# Patient Record
Sex: Female | Born: 1935
Health system: Southern US, Community
[De-identification: ages and names within clinical notes are randomized; demographics above are authoritative.]

## PROBLEM LIST (undated history)

## (undated) DIAGNOSIS — I82409 Acute embolism and thrombosis of unspecified deep veins of unspecified lower extremity: Secondary | ICD-10-CM

## (undated) DIAGNOSIS — I272 Pulmonary hypertension, unspecified: Secondary | ICD-10-CM

## (undated) DIAGNOSIS — E119 Type 2 diabetes mellitus without complications: Secondary | ICD-10-CM

## (undated) DIAGNOSIS — C801 Malignant (primary) neoplasm, unspecified: Secondary | ICD-10-CM

## (undated) DIAGNOSIS — I10 Essential (primary) hypertension: Secondary | ICD-10-CM

## (undated) DIAGNOSIS — I2510 Atherosclerotic heart disease of native coronary artery without angina pectoris: Secondary | ICD-10-CM

## (undated) DIAGNOSIS — E7800 Pure hypercholesterolemia, unspecified: Secondary | ICD-10-CM

## (undated) DIAGNOSIS — G629 Polyneuropathy, unspecified: Secondary | ICD-10-CM

## (undated) DIAGNOSIS — I4891 Unspecified atrial fibrillation: Secondary | ICD-10-CM

## (undated) HISTORY — PX: EXPLORATION POST OPERATIVE OPEN HEART: SHX5061

## (undated) HISTORY — PX: ABDOMINAL HYSTERECTOMY: SHX81

---

## 2004-01-16 ENCOUNTER — Ambulatory Visit: Payer: Self-pay | Admitting: Oncology

## 2004-02-16 ENCOUNTER — Ambulatory Visit: Payer: Self-pay | Admitting: Oncology

## 2004-03-17 ENCOUNTER — Ambulatory Visit: Payer: Self-pay | Admitting: Oncology

## 2004-04-17 ENCOUNTER — Ambulatory Visit: Payer: Self-pay | Admitting: Oncology

## 2004-05-16 ENCOUNTER — Ambulatory Visit: Payer: Self-pay | Admitting: Unknown Physician Specialty

## 2004-05-17 ENCOUNTER — Inpatient Hospital Stay: Payer: Self-pay | Admitting: Unknown Physician Specialty

## 2004-05-18 ENCOUNTER — Ambulatory Visit: Payer: Self-pay | Admitting: Oncology

## 2004-06-15 ENCOUNTER — Ambulatory Visit: Payer: Self-pay | Admitting: Oncology

## 2004-07-16 ENCOUNTER — Ambulatory Visit: Payer: Self-pay | Admitting: Oncology

## 2004-08-15 ENCOUNTER — Ambulatory Visit: Payer: Self-pay | Admitting: Oncology

## 2004-08-16 ENCOUNTER — Ambulatory Visit: Payer: Self-pay | Admitting: Ophthalmology

## 2004-08-23 ENCOUNTER — Ambulatory Visit: Payer: Self-pay | Admitting: Ophthalmology

## 2004-09-15 ENCOUNTER — Ambulatory Visit: Payer: Self-pay | Admitting: Oncology

## 2004-10-05 ENCOUNTER — Ambulatory Visit: Payer: Self-pay | Admitting: Internal Medicine

## 2004-10-15 ENCOUNTER — Ambulatory Visit: Payer: Self-pay | Admitting: Oncology

## 2004-10-31 ENCOUNTER — Ambulatory Visit: Payer: Self-pay | Admitting: Unknown Physician Specialty

## 2004-11-15 ENCOUNTER — Ambulatory Visit: Payer: Self-pay | Admitting: Oncology

## 2005-03-07 ENCOUNTER — Ambulatory Visit: Payer: Self-pay | Admitting: Oncology

## 2005-03-17 ENCOUNTER — Ambulatory Visit: Payer: Self-pay | Admitting: Oncology

## 2005-05-03 ENCOUNTER — Ambulatory Visit: Payer: Self-pay | Admitting: Oncology

## 2005-05-18 ENCOUNTER — Ambulatory Visit: Payer: Self-pay | Admitting: Oncology

## 2005-07-31 ENCOUNTER — Ambulatory Visit: Payer: Self-pay | Admitting: Internal Medicine

## 2005-09-01 ENCOUNTER — Ambulatory Visit: Payer: Self-pay | Admitting: Oncology

## 2005-09-02 ENCOUNTER — Emergency Department: Payer: Self-pay | Admitting: Emergency Medicine

## 2005-09-15 ENCOUNTER — Ambulatory Visit: Payer: Self-pay | Admitting: Oncology

## 2005-10-15 ENCOUNTER — Ambulatory Visit: Payer: Self-pay | Admitting: Oncology

## 2005-10-16 ENCOUNTER — Ambulatory Visit: Payer: Self-pay | Admitting: Internal Medicine

## 2006-01-25 ENCOUNTER — Ambulatory Visit: Payer: Self-pay | Admitting: Internal Medicine

## 2006-02-28 ENCOUNTER — Ambulatory Visit: Payer: Self-pay | Admitting: Oncology

## 2006-03-17 ENCOUNTER — Ambulatory Visit: Payer: Self-pay | Admitting: Oncology

## 2006-08-29 ENCOUNTER — Ambulatory Visit: Payer: Self-pay | Admitting: Oncology

## 2006-09-16 ENCOUNTER — Ambulatory Visit: Payer: Self-pay | Admitting: Oncology

## 2006-10-25 ENCOUNTER — Ambulatory Visit: Payer: Self-pay | Admitting: Internal Medicine

## 2006-11-02 ENCOUNTER — Ambulatory Visit: Payer: Self-pay | Admitting: Internal Medicine

## 2007-02-20 ENCOUNTER — Ambulatory Visit: Payer: Self-pay | Admitting: Unknown Physician Specialty

## 2007-03-04 ENCOUNTER — Ambulatory Visit: Payer: Self-pay | Admitting: Oncology

## 2007-03-18 ENCOUNTER — Ambulatory Visit: Payer: Self-pay | Admitting: Oncology

## 2007-04-18 ENCOUNTER — Ambulatory Visit: Payer: Self-pay | Admitting: Oncology

## 2007-05-20 ENCOUNTER — Ambulatory Visit: Payer: Self-pay | Admitting: Oncology

## 2007-09-16 ENCOUNTER — Ambulatory Visit: Payer: Self-pay | Admitting: Oncology

## 2007-09-19 ENCOUNTER — Ambulatory Visit: Payer: Self-pay | Admitting: Internal Medicine

## 2007-09-23 ENCOUNTER — Ambulatory Visit: Payer: Self-pay | Admitting: Oncology

## 2007-10-16 ENCOUNTER — Ambulatory Visit: Payer: Self-pay | Admitting: Oncology

## 2007-10-16 ENCOUNTER — Ambulatory Visit: Payer: Self-pay | Admitting: Internal Medicine

## 2007-11-16 ENCOUNTER — Ambulatory Visit: Payer: Self-pay | Admitting: Oncology

## 2007-12-17 ENCOUNTER — Ambulatory Visit: Payer: Self-pay | Admitting: Oncology

## 2008-03-17 ENCOUNTER — Ambulatory Visit: Payer: Self-pay | Admitting: Oncology

## 2008-04-01 ENCOUNTER — Ambulatory Visit: Payer: Self-pay | Admitting: Oncology

## 2008-04-17 ENCOUNTER — Ambulatory Visit: Payer: Self-pay | Admitting: Oncology

## 2008-09-15 ENCOUNTER — Ambulatory Visit: Payer: Self-pay | Admitting: Oncology

## 2008-10-01 ENCOUNTER — Ambulatory Visit: Payer: Self-pay | Admitting: Oncology

## 2008-10-15 ENCOUNTER — Ambulatory Visit: Payer: Self-pay | Admitting: Oncology

## 2008-11-15 ENCOUNTER — Ambulatory Visit: Payer: Self-pay | Admitting: Oncology

## 2009-01-15 ENCOUNTER — Ambulatory Visit: Payer: Self-pay | Admitting: Oncology

## 2009-01-31 ENCOUNTER — Inpatient Hospital Stay: Payer: Self-pay | Admitting: Internal Medicine

## 2009-02-15 ENCOUNTER — Ambulatory Visit: Payer: Self-pay | Admitting: Oncology

## 2009-05-27 ENCOUNTER — Encounter: Payer: Self-pay | Admitting: Cardiology

## 2009-06-15 ENCOUNTER — Encounter: Payer: Self-pay | Admitting: Cardiology

## 2009-09-15 ENCOUNTER — Ambulatory Visit: Payer: Self-pay | Admitting: Oncology

## 2009-09-30 ENCOUNTER — Ambulatory Visit: Payer: Self-pay | Admitting: Oncology

## 2009-10-15 ENCOUNTER — Ambulatory Visit: Payer: Self-pay | Admitting: Oncology

## 2010-01-05 ENCOUNTER — Ambulatory Visit: Payer: Self-pay | Admitting: Internal Medicine

## 2010-10-04 ENCOUNTER — Ambulatory Visit: Payer: Self-pay | Admitting: Oncology

## 2010-10-16 ENCOUNTER — Ambulatory Visit: Payer: Self-pay | Admitting: Oncology

## 2010-10-31 ENCOUNTER — Ambulatory Visit: Payer: Self-pay

## 2011-01-30 ENCOUNTER — Ambulatory Visit: Payer: Self-pay | Admitting: Internal Medicine

## 2011-10-05 ENCOUNTER — Ambulatory Visit: Payer: Self-pay | Admitting: Oncology

## 2011-10-05 LAB — COMPREHENSIVE METABOLIC PANEL
Alkaline Phosphatase: 113 U/L (ref 50–136)
Anion Gap: 10 (ref 7–16)
BUN: 16 mg/dL (ref 7–18)
Calcium, Total: 9 mg/dL (ref 8.5–10.1)
Co2: 24 mmol/L (ref 21–32)
EGFR (African American): 54 — ABNORMAL LOW
EGFR (Non-African Amer.): 47 — ABNORMAL LOW
Potassium: 4.4 mmol/L (ref 3.5–5.1)
SGPT (ALT): 30 U/L
Sodium: 138 mmol/L (ref 136–145)
Total Protein: 7.6 g/dL (ref 6.4–8.2)

## 2011-10-05 LAB — CBC CANCER CENTER
Basophil #: 0 x10 3/mm (ref 0.0–0.1)
Basophil %: 0.6 %
Eosinophil #: 0.1 x10 3/mm (ref 0.0–0.7)
Eosinophil %: 0.9 %
HGB: 12.8 g/dL (ref 12.0–16.0)
Lymphocyte #: 2 x10 3/mm (ref 1.0–3.6)
Lymphocyte %: 28.4 %
MCH: 29.1 pg (ref 26.0–34.0)
MCHC: 31.8 g/dL — ABNORMAL LOW (ref 32.0–36.0)
MCV: 92 fL (ref 80–100)
Monocyte #: 0.4 x10 3/mm (ref 0.2–0.9)
Monocyte %: 5.4 %
RDW: 14.6 % — ABNORMAL HIGH (ref 11.5–14.5)
WBC: 7.2 x10 3/mm (ref 3.6–11.0)

## 2011-10-06 LAB — CANCER ANTIGEN 27.29: CA 27.29: 14.5 U/mL (ref 0.0–38.6)

## 2011-10-16 ENCOUNTER — Ambulatory Visit: Payer: Self-pay | Admitting: Oncology

## 2012-02-02 ENCOUNTER — Ambulatory Visit: Payer: Self-pay

## 2013-02-03 ENCOUNTER — Ambulatory Visit: Payer: Self-pay

## 2014-02-13 ENCOUNTER — Ambulatory Visit: Payer: Self-pay | Admitting: Internal Medicine

## 2014-05-20 DIAGNOSIS — Z79899 Other long term (current) drug therapy: Secondary | ICD-10-CM | POA: Diagnosis not present

## 2014-05-20 DIAGNOSIS — E119 Type 2 diabetes mellitus without complications: Secondary | ICD-10-CM | POA: Diagnosis not present

## 2014-05-27 DIAGNOSIS — E785 Hyperlipidemia, unspecified: Secondary | ICD-10-CM | POA: Diagnosis not present

## 2014-05-27 DIAGNOSIS — I10 Essential (primary) hypertension: Secondary | ICD-10-CM | POA: Diagnosis not present

## 2014-05-27 DIAGNOSIS — E119 Type 2 diabetes mellitus without complications: Secondary | ICD-10-CM | POA: Diagnosis not present

## 2014-06-04 DIAGNOSIS — I2510 Atherosclerotic heart disease of native coronary artery without angina pectoris: Secondary | ICD-10-CM | POA: Diagnosis not present

## 2014-06-04 DIAGNOSIS — I10 Essential (primary) hypertension: Secondary | ICD-10-CM | POA: Diagnosis not present

## 2014-06-04 DIAGNOSIS — E782 Mixed hyperlipidemia: Secondary | ICD-10-CM | POA: Diagnosis not present

## 2014-06-04 DIAGNOSIS — E119 Type 2 diabetes mellitus without complications: Secondary | ICD-10-CM | POA: Diagnosis not present

## 2014-08-27 DIAGNOSIS — E119 Type 2 diabetes mellitus without complications: Secondary | ICD-10-CM | POA: Diagnosis not present

## 2014-08-27 DIAGNOSIS — Z79899 Other long term (current) drug therapy: Secondary | ICD-10-CM | POA: Diagnosis not present

## 2014-08-27 DIAGNOSIS — I10 Essential (primary) hypertension: Secondary | ICD-10-CM | POA: Diagnosis not present

## 2014-08-27 DIAGNOSIS — I2510 Atherosclerotic heart disease of native coronary artery without angina pectoris: Secondary | ICD-10-CM | POA: Diagnosis not present

## 2014-08-27 DIAGNOSIS — E782 Mixed hyperlipidemia: Secondary | ICD-10-CM | POA: Diagnosis not present

## 2014-09-03 DIAGNOSIS — I10 Essential (primary) hypertension: Secondary | ICD-10-CM | POA: Diagnosis not present

## 2014-09-03 DIAGNOSIS — I2510 Atherosclerotic heart disease of native coronary artery without angina pectoris: Secondary | ICD-10-CM | POA: Diagnosis not present

## 2014-09-03 DIAGNOSIS — E782 Mixed hyperlipidemia: Secondary | ICD-10-CM | POA: Diagnosis not present

## 2014-09-03 DIAGNOSIS — R0789 Other chest pain: Secondary | ICD-10-CM | POA: Diagnosis not present

## 2014-10-06 DIAGNOSIS — E119 Type 2 diabetes mellitus without complications: Secondary | ICD-10-CM | POA: Diagnosis not present

## 2014-11-11 DIAGNOSIS — L821 Other seborrheic keratosis: Secondary | ICD-10-CM | POA: Diagnosis not present

## 2014-11-11 DIAGNOSIS — L570 Actinic keratosis: Secondary | ICD-10-CM | POA: Diagnosis not present

## 2014-11-11 DIAGNOSIS — Z1283 Encounter for screening for malignant neoplasm of skin: Secondary | ICD-10-CM | POA: Diagnosis not present

## 2014-11-11 DIAGNOSIS — D230 Other benign neoplasm of skin of lip: Secondary | ICD-10-CM | POA: Diagnosis not present

## 2014-11-18 ENCOUNTER — Emergency Department: Payer: Commercial Managed Care - HMO

## 2014-11-18 ENCOUNTER — Encounter: Payer: Self-pay | Admitting: Medical Oncology

## 2014-11-18 ENCOUNTER — Emergency Department
Admission: EM | Admit: 2014-11-18 | Discharge: 2014-11-18 | Disposition: A | Discharge status: Home / Self Care | Payer: Commercial Managed Care - HMO | Attending: Emergency Medicine | Admitting: Emergency Medicine

## 2014-11-18 ENCOUNTER — Other Ambulatory Visit: Payer: Self-pay

## 2014-11-18 DIAGNOSIS — Z7982 Long term (current) use of aspirin: Secondary | ICD-10-CM | POA: Diagnosis not present

## 2014-11-18 DIAGNOSIS — Z79899 Other long term (current) drug therapy: Secondary | ICD-10-CM | POA: Diagnosis not present

## 2014-11-18 DIAGNOSIS — I10 Essential (primary) hypertension: Secondary | ICD-10-CM | POA: Diagnosis not present

## 2014-11-18 DIAGNOSIS — R51 Headache: Secondary | ICD-10-CM | POA: Diagnosis present

## 2014-11-18 DIAGNOSIS — E119 Type 2 diabetes mellitus without complications: Secondary | ICD-10-CM | POA: Diagnosis not present

## 2014-11-18 DIAGNOSIS — M5022 Other cervical disc displacement, mid-cervical region: Secondary | ICD-10-CM | POA: Diagnosis not present

## 2014-11-18 DIAGNOSIS — R079 Chest pain, unspecified: Secondary | ICD-10-CM | POA: Diagnosis not present

## 2014-11-18 DIAGNOSIS — D329 Benign neoplasm of meninges, unspecified: Secondary | ICD-10-CM | POA: Diagnosis not present

## 2014-11-18 DIAGNOSIS — Z794 Long term (current) use of insulin: Secondary | ICD-10-CM | POA: Diagnosis not present

## 2014-11-18 DIAGNOSIS — G9389 Other specified disorders of brain: Secondary | ICD-10-CM | POA: Diagnosis not present

## 2014-11-18 DIAGNOSIS — G4459 Other complicated headache syndrome: Secondary | ICD-10-CM

## 2014-11-18 HISTORY — DX: Essential (primary) hypertension: I10

## 2014-11-18 HISTORY — DX: Malignant (primary) neoplasm, unspecified: C80.1

## 2014-11-18 HISTORY — DX: Type 2 diabetes mellitus without complications: E11.9

## 2014-11-18 LAB — BASIC METABOLIC PANEL
ANION GAP: 9 (ref 5–15)
BUN: 17 mg/dL (ref 6–20)
CO2: 27 mmol/L (ref 22–32)
Calcium: 8.8 mg/dL — ABNORMAL LOW (ref 8.9–10.3)
Chloride: 102 mmol/L (ref 101–111)
Creatinine, Ser: 1.11 mg/dL — ABNORMAL HIGH (ref 0.44–1.00)
GFR calc Af Amer: 51 mL/min — ABNORMAL LOW (ref >60)
GFR, EST NON AFRICAN AMERICAN: 44 mL/min — AB (ref >60)
GLUCOSE: 159 mg/dL — AB (ref 65–99)
POTASSIUM: 4.7 mmol/L (ref 3.5–5.1)
Sodium: 138 mmol/L (ref 135–145)

## 2014-11-18 LAB — CBC
HCT: 40.7 % (ref 35.0–47.0)
Hemoglobin: 12.9 g/dL (ref 12.0–16.0)
MCH: 28.2 pg (ref 26.0–34.0)
MCHC: 31.7 g/dL — ABNORMAL LOW (ref 32.0–36.0)
MCV: 89 fL (ref 80.0–100.0)
PLATELETS: 209 10*3/uL (ref 150–440)
RBC: 4.57 MIL/uL (ref 3.80–5.20)
RDW: 15.1 % — AB (ref 11.5–14.5)
WBC: 7.5 10*3/uL (ref 3.6–11.0)

## 2014-11-18 LAB — TROPONIN I
Troponin I: <0.03 ng/mL (ref <0.031)
Troponin I: <0.03 ng/mL (ref <0.031)

## 2014-11-18 MED ORDER — FENTANYL CITRATE (PF) 100 MCG/2ML IJ SOLN
25.0000 ug | Freq: Once | INTRAMUSCULAR | Status: AC
  Administered 2014-11-18: 25 ug via INTRAVENOUS
  Filled 2014-11-18: qty 2

## 2014-11-18 MED ORDER — GABAPENTIN 300 MG PO CAPS
300.0000 mg | ORAL_CAPSULE | Freq: Two times a day (BID) | ORAL | Status: DC
  Administered 2014-11-18: 300 mg via ORAL
  Filled 2014-11-18: qty 1

## 2014-11-18 NOTE — ED Notes (Signed)
Pt with daughter who reports pt began having headache to the back of her head last night. Denies injury. Pt alert and oriented x4, speaking in complete sentences. Pt also reports that she has been having intermittent rt sided chest pain for about 3 months.

## 2014-11-18 NOTE — ED Provider Notes (Signed)
[facility] Emergency Department Provider Note  ____________________________________________  Time seen: Approximately 12:42 PM  I have reviewed the triage vital signs and the nursing notes.   HISTORY  Chief Complaint Headache and Chest Pain    HPI Pamela Chaney is a 81 y.o. female patient complains of sharp stabbing pain in the right side of the back of the head last seconds and goes away started last night. Nothing seems to bring it on or make it better or worse. Patient is never had this before. Pain is severe   Past Medical History  Diagnosis Date  . Hypertension   . Diabetes mellitus without complication   . Cancer     colon    There are no active problems to display for this patient.   Past Surgical History  Procedure Laterality Date  . Exploration post operative open heart    . Abdominal hysterectomy      Current Outpatient Rx  Name  Route  Sig  Dispense  Refill  . aspirin EC 81 MG tablet   Oral   Take 1 tablet by mouth daily.         Marland Kitchen glipiZIDE (GLUCOTROL) 5 MG tablet   Oral   Take 1 tablet by mouth 2 (two) times daily.         Marland Kitchen HUMULIN 70/30 KWIKPEN (70-30) 100 UNIT/ML PEN   Subcutaneous   Inject 20 Units into the skin 2 (two) times daily.            Dispense as written.   . metoprolol tartrate (LOPRESSOR) 25 MG tablet   Oral   Take 1 tablet by mouth daily.         Marland Kitchen PARoxetine (PAXIL) 20 MG tablet   Oral   Take 1 tablet by mouth daily.         Marland Kitchen acetaminophen (TYLENOL) 500 MG tablet   Oral   Take 1 tablet by mouth every 6 (six) hours as needed.         . Simethicone (GAS RELIEF PO)   Oral   Take 1 tablet by mouth daily as needed.            Allergies Lisinopril and Morphine and related  No family history on file.  Social History History  Substance Use Topics  . Smoking status: Never Smoker   . Smokeless tobacco: Not on file  . Alcohol Use: No    Review of Systems Constitutional: No  fever/chills Eyes: No visual changes. ENT: No sore throat. Cardiovascular: Denies chest pain. Respiratory: Denies shortness of breath. Gastrointestinal: No abdominal pain.  No nausea, no vomiting.  No diarrhea.  No constipation. Genitourinary: Negative for dysuria. Musculoskeletal: Negative for back pain. Skin: Negative for rash. Neurological: Negative for headaches, focal weakness or numbness.  10-point ROS otherwise negative.  ____________________________________________   PHYSICAL EXAM:  VITAL SIGNS: ED Triage Vitals  Enc Vitals Group     BP 11/18/14 0955 168/56 mmHg     Pulse Rate 11/18/14 0955 64     Resp 11/18/14 0955 19     Temp 11/18/14 0955 98.2 F (36.8 C)     Temp Source 11/18/14 0955 Oral     SpO2 11/18/14 0955 92 %     Weight 11/18/14 0955 210 lb (95.255 kg)     Height 11/18/14 0955 5\' 8"  (1.727 m)     Head Cir --      Peak Flow --      Pain Score  11/18/14 0956 8     Pain Loc --      Pain Edu? --      Excl. in [place]? --     Constitutional: Alert and oriented. Well appearing and in no acute distress. Eyes: Conjunctivae are normal. PERRL. EOMI. Head: Atraumatic. Nose: No congestion/rhinnorhea. Mouth/Throat: Mucous membranes are moist.  Oropharynx non-erythematous. Neck: No stridor.   Cardiovascular: Normal rate, regular rhythm. Grossly normal heart sounds.  Good peripheral circulation. Respiratory: Normal respiratory effort.  No retractions. Lungs CTAB. Gastrointestinal: Soft and nontender. No distention. No abdominal bruits. No CVA tenderness. Musculoskeletal: No lower extremity tenderness nor edema.  No joint effusions. Neurologic:  Normal speech and language. No gross focal neurologic deficits are appreciated. No gait instability. Cranial nerves II through XII are intact cerebellar rapid alternating movements in the hands finger to nose and heel to shin are all normal motor strength is 5 over 5 throughout sensation is intact throughout there are no trigger  points in the back of her head or neck. Make the pain come on that I can find Skin:  Skin is warm, dry and intact. No rash noted. Psychiatric: Mood and affect are normal. Speech and behavior are normal.  ____________________________________________   LABS (all labs ordered are listed, but only abnormal results are displayed)  Labs Reviewed  BASIC METABOLIC PANEL - Abnormal; Notable for the following:    Glucose, Bld 159 (*)    Creatinine, Ser 1.11 (*)    Calcium 8.8 (*)    GFR calc non Af Amer 44 (*)    GFR calc Af Amer 51 (*)    All other components within normal limits  CBC - Abnormal; Notable for the following:    MCHC 31.7 (*)    RDW 15.1 (*)    All other components within normal limits  TROPONIN I  TROPONIN I   ____________________________________________  EKG  EKG read and interpreted by me shows normal sinus rhythm. 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  Patient's chest pain has been coming and going on right side of the chest for quite a while Dr. Satira Mccallum her cardiologist is checked it over and does not feel it is anything in addition her troponin and EKG and chest x-ray looks okay here. I discussed her headache with the neurologist on call. Sharp stabbing pain at the back of the skull on the right side. In the wrong location for trigeminal neuralgia although it, sounds like that. Since it just started he  suggests watching her carefully can do outpatient follow-up. Or follow-up with her primary care.  I do not want to use NSAIDs for that reason in this patient will try Tylenol for the time being. I will try neurontin for this headache to see if it helps. I will use a lower dose for her renal function. (checked on uptodate)____________________________________________   FINAL CLINICAL IMPRESSION(S) / ED DIAGNOSES  Final diagnoses:  Other complicated headache syndrome      Nena Polio, MD 11/18/14 1550

## 2014-11-18 NOTE — Discharge Instructions (Signed)
Headaches, Frequently Asked Questions MIGRAINE HEADACHES Q: What is migraine? What causes it? How can I treat it? A: Generally, migraine headaches begin as a dull ache. Then they develop into a constant, throbbing, and pulsating pain. You may experience pain at the temples. You may experience pain at the front or back of one or both sides of the head. The pain is usually accompanied by a combination of:  Nausea.  Vomiting.  Sensitivity to light and noise. Some people (about 15%) experience an aura (see below) before an attack. The cause of migraine is believed to be chemical reactions in the brain. Treatment for migraine may include over-the-counter or prescription medications. It may also include self-help techniques. These include relaxation training and biofeedback.  Q: What is an aura? A: About 15% of people with migraine get an "aura". This is a sign of neurological symptoms that occur before a migraine headache. You may see wavy or jagged lines, dots, or flashing lights. You might experience tunnel vision or blind spots in one or both eyes. The aura can include visual or auditory hallucinations (something imagined). It may include disruptions in smell (such as strange odors), taste or touch. Other symptoms include:  Numbness.  A "pins and needles" sensation.  Difficulty in recalling or speaking the correct word. These neurological events may last as long as 60 minutes. These symptoms will fade as the headache begins. Q: What is a trigger? A: Certain physical or environmental factors can lead to or "trigger" a migraine. These include:  Foods.  Hormonal changes.  Weather.  Stress. It is important to remember that triggers are different for everyone. To help prevent migraine attacks, you need to figure out which triggers affect you. Keep a headache diary. This is a good way to track triggers. The diary will help you talk to your healthcare professional about your condition. Q: Does  weather affect migraines? A: Bright sunshine, hot, humid conditions, and drastic changes in barometric pressure may lead to, or "trigger," a migraine attack in some people. But studies have shown that weather does not act as a trigger for everyone with migraines. Q: What is the link between migraine and hormones? A: Hormones start and regulate many of your body's functions. Hormones keep your body in balance within a constantly changing environment. The levels of hormones in your body are unbalanced at times. Examples are during menstruation, pregnancy, or menopause. That can lead to a migraine attack. In fact, about three quarters of all women with migraine report that their attacks are related to the menstrual cycle.  Q: Is there an increased risk of stroke for migraine sufferers? A: The likelihood of a migraine attack causing a stroke is very remote. That is not to say that migraine sufferers cannot have a stroke associated with their migraines. In persons under age 53, the most common associated factor for stroke is migraine headache. But over the course of a person's normal life span, the occurrence of migraine headache may actually be associated with a reduced risk of dying from cerebrovascular disease due to stroke.  Q: What are acute medications for migraine? A: Acute medications are used to treat the pain of the headache after it has started. Examples over-the-counter medications, NSAIDs, ergots, and triptans.  Q: What are the triptans? A: Triptans are the newest class of abortive medications. They are specifically targeted to treat migraine. Triptans are vasoconstrictors. They moderate some chemical reactions in the brain. The triptans work on receptors in your brain. Triptans help  to restore the balance of a neurotransmitter called serotonin. Fluctuations in levels of serotonin are thought to be a main cause of migraine.  °Q: Are over-the-counter medications for migraine effective? °A:  Over-the-counter, or "OTC," medications may be effective in relieving mild to moderate pain and associated symptoms of migraine. But you should see your caregiver before beginning any treatment regimen for migraine.  °Q: What are preventive medications for migraine? °A: Preventive medications for migraine are sometimes referred to as "prophylactic" treatments. They are used to reduce the frequency, severity, and length of migraine attacks. Examples of preventive medications include antiepileptic medications, antidepressants, beta-blockers, calcium channel blockers, and NSAIDs (nonsteroidal anti-inflammatory drugs). °Q: Why are anticonvulsants used to treat migraine? °A: During the past few years, there has been an increased interest in antiepileptic drugs for the prevention of migraine. They are sometimes referred to as "anticonvulsants". Both epilepsy and migraine may be caused by similar reactions in the brain.  °Q: Why are antidepressants used to treat migraine? °A: Antidepressants are typically used to treat people with depression. They may reduce migraine frequency by regulating chemical levels, such as serotonin, in the brain.  °Q: What alternative therapies are used to treat migraine? °A: The term "alternative therapies" is often used to describe treatments considered outside the scope of conventional Western medicine. Examples of alternative therapy include acupuncture, acupressure, and yoga. Another common alternative treatment is herbal therapy. Some herbs are believed to relieve headache pain. Always discuss alternative therapies with your caregiver before proceeding. Some herbal products contain arsenic and other toxins. °TENSION HEADACHES °Q: What is a tension-type headache? What causes it? How can I treat it? °A: Tension-type headaches occur randomly. They are often the result of temporary stress, anxiety, fatigue, or anger. Symptoms include soreness in your temples, a tightening band-like sensation  around your head (a "vice-like" ache). Symptoms can also include a pulling feeling, pressure sensations, and contracting head and neck muscles. The headache begins in your forehead, temples, or the back of your head and neck. Treatment for tension-type headache may include over-the-counter or prescription medications. Treatment may also include self-help techniques such as relaxation training and biofeedback. °CLUSTER HEADACHES °Q: What is a cluster headache? What causes it? How can I treat it? °A: Cluster headache gets its name because the attacks come in groups. The pain arrives with little, if any, warning. It is usually on one side of the head. A tearing or bloodshot eye and a runny nose on the same side of the headache may also accompany the pain. Cluster headaches are believed to be caused by chemical reactions in the brain. They have been described as the most severe and intense of any headache type. Treatment for cluster headache includes prescription medication and oxygen. °SINUS HEADACHES °Q: What is a sinus headache? What causes it? How can I treat it? °A: When a cavity in the bones of the face and skull (a sinus) becomes inflamed, the inflammation will cause localized pain. This condition is usually the result of an allergic reaction, a tumor, or an infection. If your headache is caused by a sinus blockage, such as an infection, you will probably have a fever. An x-ray will confirm a sinus blockage. Your caregiver's treatment might include antibiotics for the infection, as well as antihistamines or decongestants.  °REBOUND HEADACHES °Q: What is a rebound headache? What causes it? How can I treat it? °A: A pattern of taking acute headache medications too often can lead to a condition known as "rebound headache."   A pattern of taking too much headache medication includes taking it more than 2 days per week or in excessive amounts. That means more than the label or a caregiver advises. With rebound  headaches, your medications not only stop relieving pain, they actually begin to cause headaches. Doctors treat rebound headache by tapering the medication that is being overused. Sometimes your caregiver will gradually substitute a different type of treatment or medication. Stopping may be a challenge. Regularly overusing a medication increases the potential for serious side effects. Consult a caregiver if you regularly use headache medications more than 2 days per week or more than the label advises. ADDITIONAL QUESTIONS AND ANSWERS Q: What is biofeedback? A: Biofeedback is a self-help treatment. Biofeedback uses special equipment to monitor your body's involuntary physical responses. Biofeedback monitors:  Breathing.  Pulse.  Heart rate.  Temperature.  Muscle tension.  Brain activity. Biofeedback helps you refine and perfect your relaxation exercises. You learn to control the physical responses that are related to stress. Once the technique has been mastered, you do not need the equipment any more. Q: Are headaches hereditary? A: Four out of five (80%) of people that suffer report a family history of migraine. Scientists are not sure if this is genetic or a family predisposition. Despite the uncertainty, a child has a 50% chance of having migraine if one parent suffers. The child has a 75% chance if both parents suffer.  Q: Can children get headaches? A: By the time they reach high school, most young people have experienced some type of headache. Many safe and effective approaches or medications can prevent a headache from occurring or stop it after it has begun.  Q: What type of doctor should I see to diagnose and treat my headache? A: Start with your primary caregiver. Discuss his or her experience and approach to headaches. Discuss methods of classification, diagnosis, and treatment. Your caregiver may decide to recommend you to a headache specialist, depending upon your symptoms or other  physical conditions. Having diabetes, allergies, etc., may require a more comprehensive and inclusive approach to your headache. The National Head[employer] Foundation will provide, upon request, a list of [facility] physician members in your state. Document Released: 06/24/2003 Document Revised: 06/26/2011 Document Reviewed: 12/02/2007 [facility] Patient Information 2015 [place], [place]. This information is not intended to replace advice given to you by your health care provider. Make sure you discuss any questions you have with your health care provider.  I think you have a type of occipital neuralgia. I will give you gabapentin. Take the one today in the Er then take 1 pill 2 x a day tomorrow and one 3 x a day on the 5th. Continue to take 1 pill 3 x a day for now. Follow up with your family doctorwith in the week.. She may want to refer you to a neurologist. You can increase the neurontin to a higher dose under the direction of your doctor. You can take tylenol with the neurontin(gabapentin).  Please return if you are worse or get a fever or are sicker.

## 2014-11-18 NOTE — ED Notes (Signed)
Patient denies chest pain at this time 

## 2014-11-18 NOTE — ED Notes (Signed)
Patient transported to MRI by nurse.

## 2014-11-20 DIAGNOSIS — E118 Type 2 diabetes mellitus with unspecified complications: Secondary | ICD-10-CM | POA: Diagnosis not present

## 2014-11-20 DIAGNOSIS — Z79899 Other long term (current) drug therapy: Secondary | ICD-10-CM | POA: Diagnosis not present

## 2014-11-20 DIAGNOSIS — E780 Pure hypercholesterolemia: Secondary | ICD-10-CM | POA: Diagnosis not present

## 2014-11-20 DIAGNOSIS — E1165 Type 2 diabetes mellitus with hyperglycemia: Secondary | ICD-10-CM | POA: Diagnosis not present

## 2014-11-27 DIAGNOSIS — G609 Hereditary and idiopathic neuropathy, unspecified: Secondary | ICD-10-CM | POA: Diagnosis not present

## 2014-11-27 DIAGNOSIS — E119 Type 2 diabetes mellitus without complications: Secondary | ICD-10-CM | POA: Diagnosis not present

## 2014-11-27 DIAGNOSIS — E782 Mixed hyperlipidemia: Secondary | ICD-10-CM | POA: Diagnosis not present

## 2014-11-27 DIAGNOSIS — I10 Essential (primary) hypertension: Secondary | ICD-10-CM | POA: Diagnosis not present

## 2014-11-30 DIAGNOSIS — E782 Mixed hyperlipidemia: Secondary | ICD-10-CM | POA: Diagnosis not present

## 2014-11-30 DIAGNOSIS — I481 Persistent atrial fibrillation: Secondary | ICD-10-CM | POA: Diagnosis not present

## 2014-11-30 DIAGNOSIS — I10 Essential (primary) hypertension: Secondary | ICD-10-CM | POA: Diagnosis not present

## 2014-11-30 DIAGNOSIS — I2510 Atherosclerotic heart disease of native coronary artery without angina pectoris: Secondary | ICD-10-CM | POA: Diagnosis not present

## 2014-12-02 DIAGNOSIS — I481 Persistent atrial fibrillation: Secondary | ICD-10-CM | POA: Diagnosis not present

## 2014-12-07 DIAGNOSIS — I481 Persistent atrial fibrillation: Secondary | ICD-10-CM | POA: Diagnosis not present

## 2014-12-09 DIAGNOSIS — E782 Mixed hyperlipidemia: Secondary | ICD-10-CM | POA: Diagnosis not present

## 2014-12-09 DIAGNOSIS — G609 Hereditary and idiopathic neuropathy, unspecified: Secondary | ICD-10-CM | POA: Diagnosis not present

## 2014-12-09 DIAGNOSIS — I2510 Atherosclerotic heart disease of native coronary artery without angina pectoris: Secondary | ICD-10-CM | POA: Diagnosis not present

## 2014-12-09 DIAGNOSIS — I10 Essential (primary) hypertension: Secondary | ICD-10-CM | POA: Diagnosis not present

## 2014-12-24 DIAGNOSIS — E782 Mixed hyperlipidemia: Secondary | ICD-10-CM | POA: Diagnosis not present

## 2014-12-24 DIAGNOSIS — I10 Essential (primary) hypertension: Secondary | ICD-10-CM | POA: Diagnosis not present

## 2014-12-24 DIAGNOSIS — E119 Type 2 diabetes mellitus without complications: Secondary | ICD-10-CM | POA: Diagnosis not present

## 2014-12-24 DIAGNOSIS — I2510 Atherosclerotic heart disease of native coronary artery without angina pectoris: Secondary | ICD-10-CM | POA: Diagnosis not present

## 2015-01-08 DIAGNOSIS — E119 Type 2 diabetes mellitus without complications: Secondary | ICD-10-CM | POA: Diagnosis not present

## 2015-01-08 DIAGNOSIS — I10 Essential (primary) hypertension: Secondary | ICD-10-CM | POA: Diagnosis not present

## 2015-01-08 DIAGNOSIS — E782 Mixed hyperlipidemia: Secondary | ICD-10-CM | POA: Diagnosis not present

## 2015-01-08 DIAGNOSIS — I272 Other secondary pulmonary hypertension: Secondary | ICD-10-CM | POA: Diagnosis not present

## 2015-01-08 DIAGNOSIS — G609 Hereditary and idiopathic neuropathy, unspecified: Secondary | ICD-10-CM | POA: Diagnosis not present

## 2015-01-08 DIAGNOSIS — I2510 Atherosclerotic heart disease of native coronary artery without angina pectoris: Secondary | ICD-10-CM | POA: Diagnosis not present

## 2015-01-14 DIAGNOSIS — I2510 Atherosclerotic heart disease of native coronary artery without angina pectoris: Secondary | ICD-10-CM | POA: Diagnosis not present

## 2015-01-14 DIAGNOSIS — E782 Mixed hyperlipidemia: Secondary | ICD-10-CM | POA: Diagnosis not present

## 2015-01-14 DIAGNOSIS — I272 Other secondary pulmonary hypertension: Secondary | ICD-10-CM | POA: Diagnosis not present

## 2015-01-14 DIAGNOSIS — I10 Essential (primary) hypertension: Secondary | ICD-10-CM | POA: Diagnosis not present

## 2015-01-14 DIAGNOSIS — E119 Type 2 diabetes mellitus without complications: Secondary | ICD-10-CM | POA: Diagnosis not present

## 2015-01-14 DIAGNOSIS — G609 Hereditary and idiopathic neuropathy, unspecified: Secondary | ICD-10-CM | POA: Diagnosis not present

## 2015-01-14 DIAGNOSIS — I482 Chronic atrial fibrillation: Secondary | ICD-10-CM | POA: Diagnosis not present

## 2015-01-19 DIAGNOSIS — R0902 Hypoxemia: Secondary | ICD-10-CM | POA: Diagnosis not present

## 2015-01-19 DIAGNOSIS — I2541 Coronary artery aneurysm: Secondary | ICD-10-CM | POA: Diagnosis not present

## 2015-01-19 DIAGNOSIS — D649 Anemia, unspecified: Secondary | ICD-10-CM | POA: Diagnosis not present

## 2015-01-19 DIAGNOSIS — I272 Other secondary pulmonary hypertension: Secondary | ICD-10-CM | POA: Diagnosis not present

## 2015-01-25 DIAGNOSIS — I482 Chronic atrial fibrillation: Secondary | ICD-10-CM | POA: Diagnosis not present

## 2015-01-25 DIAGNOSIS — R0602 Shortness of breath: Secondary | ICD-10-CM | POA: Diagnosis not present

## 2015-01-25 DIAGNOSIS — I272 Other secondary pulmonary hypertension: Secondary | ICD-10-CM | POA: Diagnosis not present

## 2015-01-29 DIAGNOSIS — I482 Chronic atrial fibrillation: Secondary | ICD-10-CM | POA: Diagnosis not present

## 2015-01-29 DIAGNOSIS — R0602 Shortness of breath: Secondary | ICD-10-CM | POA: Diagnosis not present

## 2015-01-29 DIAGNOSIS — I272 Other secondary pulmonary hypertension: Secondary | ICD-10-CM | POA: Diagnosis not present

## 2015-02-11 DIAGNOSIS — Z79899 Other long term (current) drug therapy: Secondary | ICD-10-CM | POA: Diagnosis not present

## 2015-02-11 DIAGNOSIS — E782 Mixed hyperlipidemia: Secondary | ICD-10-CM | POA: Diagnosis not present

## 2015-02-11 DIAGNOSIS — E119 Type 2 diabetes mellitus without complications: Secondary | ICD-10-CM | POA: Diagnosis not present

## 2015-02-11 DIAGNOSIS — I482 Chronic atrial fibrillation: Secondary | ICD-10-CM | POA: Diagnosis not present

## 2015-02-11 DIAGNOSIS — I10 Essential (primary) hypertension: Secondary | ICD-10-CM | POA: Diagnosis not present

## 2015-02-19 DIAGNOSIS — I2541 Coronary artery aneurysm: Secondary | ICD-10-CM | POA: Diagnosis not present

## 2015-02-19 DIAGNOSIS — I272 Other secondary pulmonary hypertension: Secondary | ICD-10-CM | POA: Diagnosis not present

## 2015-02-19 DIAGNOSIS — D649 Anemia, unspecified: Secondary | ICD-10-CM | POA: Diagnosis not present

## 2015-02-19 DIAGNOSIS — R0902 Hypoxemia: Secondary | ICD-10-CM | POA: Diagnosis not present

## 2015-03-01 DIAGNOSIS — I272 Other secondary pulmonary hypertension: Secondary | ICD-10-CM | POA: Diagnosis not present

## 2015-03-01 DIAGNOSIS — R0602 Shortness of breath: Secondary | ICD-10-CM | POA: Diagnosis not present

## 2015-03-01 DIAGNOSIS — I482 Chronic atrial fibrillation: Secondary | ICD-10-CM | POA: Diagnosis not present

## 2015-03-10 DIAGNOSIS — I10 Essential (primary) hypertension: Secondary | ICD-10-CM | POA: Diagnosis not present

## 2015-03-10 DIAGNOSIS — E119 Type 2 diabetes mellitus without complications: Secondary | ICD-10-CM | POA: Diagnosis not present

## 2015-03-10 DIAGNOSIS — Z79899 Other long term (current) drug therapy: Secondary | ICD-10-CM | POA: Diagnosis not present

## 2015-03-17 DIAGNOSIS — I272 Other secondary pulmonary hypertension: Secondary | ICD-10-CM | POA: Diagnosis not present

## 2015-03-17 DIAGNOSIS — I482 Chronic atrial fibrillation: Secondary | ICD-10-CM | POA: Diagnosis not present

## 2015-03-17 DIAGNOSIS — I10 Essential (primary) hypertension: Secondary | ICD-10-CM | POA: Diagnosis not present

## 2015-03-17 DIAGNOSIS — G609 Hereditary and idiopathic neuropathy, unspecified: Secondary | ICD-10-CM | POA: Diagnosis not present

## 2015-03-17 DIAGNOSIS — E782 Mixed hyperlipidemia: Secondary | ICD-10-CM | POA: Diagnosis not present

## 2015-03-17 DIAGNOSIS — E1121 Type 2 diabetes mellitus with diabetic nephropathy: Secondary | ICD-10-CM | POA: Diagnosis not present

## 2015-03-21 DIAGNOSIS — R0902 Hypoxemia: Secondary | ICD-10-CM | POA: Diagnosis not present

## 2015-03-21 DIAGNOSIS — D649 Anemia, unspecified: Secondary | ICD-10-CM | POA: Diagnosis not present

## 2015-03-21 DIAGNOSIS — I2541 Coronary artery aneurysm: Secondary | ICD-10-CM | POA: Diagnosis not present

## 2015-03-21 DIAGNOSIS — I272 Other secondary pulmonary hypertension: Secondary | ICD-10-CM | POA: Diagnosis not present

## 2015-03-31 DIAGNOSIS — I272 Other secondary pulmonary hypertension: Secondary | ICD-10-CM | POA: Diagnosis not present

## 2015-03-31 DIAGNOSIS — R0602 Shortness of breath: Secondary | ICD-10-CM | POA: Diagnosis not present

## 2015-03-31 DIAGNOSIS — I482 Chronic atrial fibrillation: Secondary | ICD-10-CM | POA: Diagnosis not present

## 2015-04-08 DIAGNOSIS — R0602 Shortness of breath: Secondary | ICD-10-CM | POA: Diagnosis not present

## 2015-04-15 DIAGNOSIS — I2510 Atherosclerotic heart disease of native coronary artery without angina pectoris: Secondary | ICD-10-CM | POA: Diagnosis not present

## 2015-04-15 DIAGNOSIS — E782 Mixed hyperlipidemia: Secondary | ICD-10-CM | POA: Diagnosis not present

## 2015-04-15 DIAGNOSIS — I272 Other secondary pulmonary hypertension: Secondary | ICD-10-CM | POA: Diagnosis not present

## 2015-04-15 DIAGNOSIS — I482 Chronic atrial fibrillation: Secondary | ICD-10-CM | POA: Diagnosis not present

## 2015-04-15 DIAGNOSIS — I10 Essential (primary) hypertension: Secondary | ICD-10-CM | POA: Diagnosis not present

## 2015-04-15 DIAGNOSIS — E1121 Type 2 diabetes mellitus with diabetic nephropathy: Secondary | ICD-10-CM | POA: Diagnosis not present

## 2015-04-21 DIAGNOSIS — I2541 Coronary artery aneurysm: Secondary | ICD-10-CM | POA: Diagnosis not present

## 2015-04-21 DIAGNOSIS — D649 Anemia, unspecified: Secondary | ICD-10-CM | POA: Diagnosis not present

## 2015-04-21 DIAGNOSIS — R0902 Hypoxemia: Secondary | ICD-10-CM | POA: Diagnosis not present

## 2015-04-21 DIAGNOSIS — I272 Other secondary pulmonary hypertension: Secondary | ICD-10-CM | POA: Diagnosis not present

## 2015-05-01 DIAGNOSIS — I482 Chronic atrial fibrillation: Secondary | ICD-10-CM | POA: Diagnosis not present

## 2015-05-01 DIAGNOSIS — R0602 Shortness of breath: Secondary | ICD-10-CM | POA: Diagnosis not present

## 2015-05-01 DIAGNOSIS — I272 Other secondary pulmonary hypertension: Secondary | ICD-10-CM | POA: Diagnosis not present

## 2015-05-22 DIAGNOSIS — I272 Other secondary pulmonary hypertension: Secondary | ICD-10-CM | POA: Diagnosis not present

## 2015-05-22 DIAGNOSIS — I2541 Coronary artery aneurysm: Secondary | ICD-10-CM | POA: Diagnosis not present

## 2015-05-22 DIAGNOSIS — D649 Anemia, unspecified: Secondary | ICD-10-CM | POA: Diagnosis not present

## 2015-05-22 DIAGNOSIS — R0902 Hypoxemia: Secondary | ICD-10-CM | POA: Diagnosis not present

## 2015-06-01 DIAGNOSIS — I482 Chronic atrial fibrillation: Secondary | ICD-10-CM | POA: Diagnosis not present

## 2015-06-01 DIAGNOSIS — I272 Other secondary pulmonary hypertension: Secondary | ICD-10-CM | POA: Diagnosis not present

## 2015-06-01 DIAGNOSIS — R0602 Shortness of breath: Secondary | ICD-10-CM | POA: Diagnosis not present

## 2015-06-16 DIAGNOSIS — I272 Other secondary pulmonary hypertension: Secondary | ICD-10-CM | POA: Diagnosis not present

## 2015-06-16 DIAGNOSIS — E1140 Type 2 diabetes mellitus with diabetic neuropathy, unspecified: Secondary | ICD-10-CM | POA: Diagnosis not present

## 2015-06-16 DIAGNOSIS — E782 Mixed hyperlipidemia: Secondary | ICD-10-CM | POA: Diagnosis not present

## 2015-06-16 DIAGNOSIS — Z79899 Other long term (current) drug therapy: Secondary | ICD-10-CM | POA: Diagnosis not present

## 2015-06-16 DIAGNOSIS — Z23 Encounter for immunization: Secondary | ICD-10-CM | POA: Diagnosis not present

## 2015-06-16 DIAGNOSIS — I10 Essential (primary) hypertension: Secondary | ICD-10-CM | POA: Diagnosis not present

## 2015-06-16 DIAGNOSIS — E1121 Type 2 diabetes mellitus with diabetic nephropathy: Secondary | ICD-10-CM | POA: Diagnosis not present

## 2015-06-16 DIAGNOSIS — Z794 Long term (current) use of insulin: Secondary | ICD-10-CM | POA: Diagnosis not present

## 2015-06-16 DIAGNOSIS — I482 Chronic atrial fibrillation: Secondary | ICD-10-CM | POA: Diagnosis not present

## 2015-06-19 DIAGNOSIS — R0902 Hypoxemia: Secondary | ICD-10-CM | POA: Diagnosis not present

## 2015-06-19 DIAGNOSIS — I272 Other secondary pulmonary hypertension: Secondary | ICD-10-CM | POA: Diagnosis not present

## 2015-06-19 DIAGNOSIS — I2541 Coronary artery aneurysm: Secondary | ICD-10-CM | POA: Diagnosis not present

## 2015-06-19 DIAGNOSIS — D649 Anemia, unspecified: Secondary | ICD-10-CM | POA: Diagnosis not present

## 2015-06-29 DIAGNOSIS — I272 Other secondary pulmonary hypertension: Secondary | ICD-10-CM | POA: Diagnosis not present

## 2015-06-29 DIAGNOSIS — I482 Chronic atrial fibrillation: Secondary | ICD-10-CM | POA: Diagnosis not present

## 2015-06-29 DIAGNOSIS — R0602 Shortness of breath: Secondary | ICD-10-CM | POA: Diagnosis not present

## 2015-07-20 DIAGNOSIS — D649 Anemia, unspecified: Secondary | ICD-10-CM | POA: Diagnosis not present

## 2015-07-20 DIAGNOSIS — E1140 Type 2 diabetes mellitus with diabetic neuropathy, unspecified: Secondary | ICD-10-CM | POA: Diagnosis not present

## 2015-07-20 DIAGNOSIS — R0902 Hypoxemia: Secondary | ICD-10-CM | POA: Diagnosis not present

## 2015-07-20 DIAGNOSIS — Z794 Long term (current) use of insulin: Secondary | ICD-10-CM | POA: Diagnosis not present

## 2015-07-20 DIAGNOSIS — I2541 Coronary artery aneurysm: Secondary | ICD-10-CM | POA: Diagnosis not present

## 2015-07-20 DIAGNOSIS — I272 Other secondary pulmonary hypertension: Secondary | ICD-10-CM | POA: Diagnosis not present

## 2015-07-30 DIAGNOSIS — I272 Other secondary pulmonary hypertension: Secondary | ICD-10-CM | POA: Diagnosis not present

## 2015-07-30 DIAGNOSIS — R0602 Shortness of breath: Secondary | ICD-10-CM | POA: Diagnosis not present

## 2015-07-30 DIAGNOSIS — I482 Chronic atrial fibrillation: Secondary | ICD-10-CM | POA: Diagnosis not present

## 2015-08-19 DIAGNOSIS — I272 Other secondary pulmonary hypertension: Secondary | ICD-10-CM | POA: Diagnosis not present

## 2015-08-19 DIAGNOSIS — I2541 Coronary artery aneurysm: Secondary | ICD-10-CM | POA: Diagnosis not present

## 2015-08-19 DIAGNOSIS — R0902 Hypoxemia: Secondary | ICD-10-CM | POA: Diagnosis not present

## 2015-08-19 DIAGNOSIS — D649 Anemia, unspecified: Secondary | ICD-10-CM | POA: Diagnosis not present

## 2015-08-29 DIAGNOSIS — I272 Other secondary pulmonary hypertension: Secondary | ICD-10-CM | POA: Diagnosis not present

## 2015-08-29 DIAGNOSIS — R0602 Shortness of breath: Secondary | ICD-10-CM | POA: Diagnosis not present

## 2015-08-29 DIAGNOSIS — I482 Chronic atrial fibrillation: Secondary | ICD-10-CM | POA: Diagnosis not present

## 2015-09-16 DIAGNOSIS — I10 Essential (primary) hypertension: Secondary | ICD-10-CM | POA: Diagnosis not present

## 2015-09-16 DIAGNOSIS — Z79899 Other long term (current) drug therapy: Secondary | ICD-10-CM | POA: Diagnosis not present

## 2015-09-16 DIAGNOSIS — E782 Mixed hyperlipidemia: Secondary | ICD-10-CM | POA: Diagnosis not present

## 2015-09-16 DIAGNOSIS — G609 Hereditary and idiopathic neuropathy, unspecified: Secondary | ICD-10-CM | POA: Diagnosis not present

## 2015-09-16 DIAGNOSIS — E119 Type 2 diabetes mellitus without complications: Secondary | ICD-10-CM | POA: Diagnosis not present

## 2015-09-17 DIAGNOSIS — E119 Type 2 diabetes mellitus without complications: Secondary | ICD-10-CM | POA: Diagnosis not present

## 2015-09-19 DIAGNOSIS — I272 Other secondary pulmonary hypertension: Secondary | ICD-10-CM | POA: Diagnosis not present

## 2015-09-19 DIAGNOSIS — I2541 Coronary artery aneurysm: Secondary | ICD-10-CM | POA: Diagnosis not present

## 2015-09-19 DIAGNOSIS — R0902 Hypoxemia: Secondary | ICD-10-CM | POA: Diagnosis not present

## 2015-09-19 DIAGNOSIS — D649 Anemia, unspecified: Secondary | ICD-10-CM | POA: Diagnosis not present

## 2015-09-29 DIAGNOSIS — I272 Other secondary pulmonary hypertension: Secondary | ICD-10-CM | POA: Diagnosis not present

## 2015-09-29 DIAGNOSIS — R0602 Shortness of breath: Secondary | ICD-10-CM | POA: Diagnosis not present

## 2015-09-29 DIAGNOSIS — I482 Chronic atrial fibrillation: Secondary | ICD-10-CM | POA: Diagnosis not present

## 2015-10-14 DIAGNOSIS — I10 Essential (primary) hypertension: Secondary | ICD-10-CM | POA: Diagnosis not present

## 2015-10-14 DIAGNOSIS — I482 Chronic atrial fibrillation: Secondary | ICD-10-CM | POA: Diagnosis not present

## 2015-10-14 DIAGNOSIS — Z794 Long term (current) use of insulin: Secondary | ICD-10-CM | POA: Diagnosis not present

## 2015-10-14 DIAGNOSIS — I5031 Acute diastolic (congestive) heart failure: Secondary | ICD-10-CM | POA: Diagnosis not present

## 2015-10-14 DIAGNOSIS — F5104 Psychophysiologic insomnia: Secondary | ICD-10-CM | POA: Diagnosis not present

## 2015-10-14 DIAGNOSIS — E782 Mixed hyperlipidemia: Secondary | ICD-10-CM | POA: Diagnosis not present

## 2015-10-14 DIAGNOSIS — E1140 Type 2 diabetes mellitus with diabetic neuropathy, unspecified: Secondary | ICD-10-CM | POA: Diagnosis not present

## 2015-10-19 DIAGNOSIS — I272 Other secondary pulmonary hypertension: Secondary | ICD-10-CM | POA: Diagnosis not present

## 2015-10-19 DIAGNOSIS — D649 Anemia, unspecified: Secondary | ICD-10-CM | POA: Diagnosis not present

## 2015-10-19 DIAGNOSIS — R0902 Hypoxemia: Secondary | ICD-10-CM | POA: Diagnosis not present

## 2015-10-19 DIAGNOSIS — I2541 Coronary artery aneurysm: Secondary | ICD-10-CM | POA: Diagnosis not present

## 2015-10-29 DIAGNOSIS — I272 Other secondary pulmonary hypertension: Secondary | ICD-10-CM | POA: Diagnosis not present

## 2015-10-29 DIAGNOSIS — R0602 Shortness of breath: Secondary | ICD-10-CM | POA: Diagnosis not present

## 2015-10-29 DIAGNOSIS — I482 Chronic atrial fibrillation: Secondary | ICD-10-CM | POA: Diagnosis not present

## 2015-11-19 DIAGNOSIS — R0902 Hypoxemia: Secondary | ICD-10-CM | POA: Diagnosis not present

## 2015-11-19 DIAGNOSIS — D649 Anemia, unspecified: Secondary | ICD-10-CM | POA: Diagnosis not present

## 2015-11-19 DIAGNOSIS — I2541 Coronary artery aneurysm: Secondary | ICD-10-CM | POA: Diagnosis not present

## 2015-11-19 DIAGNOSIS — I272 Other secondary pulmonary hypertension: Secondary | ICD-10-CM | POA: Diagnosis not present

## 2015-11-29 DIAGNOSIS — I272 Other secondary pulmonary hypertension: Secondary | ICD-10-CM | POA: Diagnosis not present

## 2015-11-29 DIAGNOSIS — R0602 Shortness of breath: Secondary | ICD-10-CM | POA: Diagnosis not present

## 2015-11-29 DIAGNOSIS — I482 Chronic atrial fibrillation: Secondary | ICD-10-CM | POA: Diagnosis not present

## 2015-12-16 DIAGNOSIS — E1121 Type 2 diabetes mellitus with diabetic nephropathy: Secondary | ICD-10-CM | POA: Diagnosis not present

## 2015-12-16 DIAGNOSIS — I272 Other secondary pulmonary hypertension: Secondary | ICD-10-CM | POA: Diagnosis not present

## 2015-12-16 DIAGNOSIS — E1140 Type 2 diabetes mellitus with diabetic neuropathy, unspecified: Secondary | ICD-10-CM | POA: Diagnosis not present

## 2015-12-16 DIAGNOSIS — I10 Essential (primary) hypertension: Secondary | ICD-10-CM | POA: Diagnosis not present

## 2015-12-16 DIAGNOSIS — E782 Mixed hyperlipidemia: Secondary | ICD-10-CM | POA: Diagnosis not present

## 2015-12-16 DIAGNOSIS — Z79899 Other long term (current) drug therapy: Secondary | ICD-10-CM | POA: Diagnosis not present

## 2015-12-16 DIAGNOSIS — Z794 Long term (current) use of insulin: Secondary | ICD-10-CM | POA: Diagnosis not present

## 2015-12-16 DIAGNOSIS — R29898 Other symptoms and signs involving the musculoskeletal system: Secondary | ICD-10-CM | POA: Diagnosis not present

## 2015-12-20 DIAGNOSIS — D649 Anemia, unspecified: Secondary | ICD-10-CM | POA: Diagnosis not present

## 2015-12-20 DIAGNOSIS — R0902 Hypoxemia: Secondary | ICD-10-CM | POA: Diagnosis not present

## 2015-12-20 DIAGNOSIS — I2541 Coronary artery aneurysm: Secondary | ICD-10-CM | POA: Diagnosis not present

## 2015-12-20 DIAGNOSIS — I272 Other secondary pulmonary hypertension: Secondary | ICD-10-CM | POA: Diagnosis not present

## 2015-12-30 DIAGNOSIS — I272 Other secondary pulmonary hypertension: Secondary | ICD-10-CM | POA: Diagnosis not present

## 2015-12-30 DIAGNOSIS — R0602 Shortness of breath: Secondary | ICD-10-CM | POA: Diagnosis not present

## 2015-12-30 DIAGNOSIS — I482 Chronic atrial fibrillation: Secondary | ICD-10-CM | POA: Diagnosis not present

## 2016-01-29 DIAGNOSIS — R0602 Shortness of breath: Secondary | ICD-10-CM | POA: Diagnosis not present

## 2016-02-19 DIAGNOSIS — I2541 Coronary artery aneurysm: Secondary | ICD-10-CM | POA: Diagnosis not present

## 2016-02-19 DIAGNOSIS — D649 Anemia, unspecified: Secondary | ICD-10-CM | POA: Diagnosis not present

## 2016-02-19 DIAGNOSIS — R0902 Hypoxemia: Secondary | ICD-10-CM | POA: Diagnosis not present

## 2016-02-29 DIAGNOSIS — I482 Chronic atrial fibrillation: Secondary | ICD-10-CM | POA: Diagnosis not present

## 2016-02-29 DIAGNOSIS — R0602 Shortness of breath: Secondary | ICD-10-CM | POA: Diagnosis not present

## 2016-03-07 DIAGNOSIS — E1140 Type 2 diabetes mellitus with diabetic neuropathy, unspecified: Secondary | ICD-10-CM | POA: Diagnosis not present

## 2016-03-07 DIAGNOSIS — I10 Essential (primary) hypertension: Secondary | ICD-10-CM | POA: Diagnosis not present

## 2016-03-07 DIAGNOSIS — Z79899 Other long term (current) drug therapy: Secondary | ICD-10-CM | POA: Diagnosis not present

## 2016-03-07 DIAGNOSIS — Z794 Long term (current) use of insulin: Secondary | ICD-10-CM | POA: Diagnosis not present

## 2016-03-07 DIAGNOSIS — E782 Mixed hyperlipidemia: Secondary | ICD-10-CM | POA: Diagnosis not present

## 2016-03-14 DIAGNOSIS — E782 Mixed hyperlipidemia: Secondary | ICD-10-CM | POA: Diagnosis not present

## 2016-03-14 DIAGNOSIS — I10 Essential (primary) hypertension: Secondary | ICD-10-CM | POA: Diagnosis not present

## 2016-03-14 DIAGNOSIS — Z79899 Other long term (current) drug therapy: Secondary | ICD-10-CM | POA: Diagnosis not present

## 2016-03-14 DIAGNOSIS — E1140 Type 2 diabetes mellitus with diabetic neuropathy, unspecified: Secondary | ICD-10-CM | POA: Diagnosis not present

## 2016-03-14 DIAGNOSIS — Z794 Long term (current) use of insulin: Secondary | ICD-10-CM | POA: Diagnosis not present

## 2016-03-17 DIAGNOSIS — E782 Mixed hyperlipidemia: Secondary | ICD-10-CM | POA: Diagnosis not present

## 2016-03-17 DIAGNOSIS — I10 Essential (primary) hypertension: Secondary | ICD-10-CM | POA: Diagnosis not present

## 2016-03-17 DIAGNOSIS — Z794 Long term (current) use of insulin: Secondary | ICD-10-CM | POA: Diagnosis not present

## 2016-03-17 DIAGNOSIS — I272 Pulmonary hypertension, unspecified: Secondary | ICD-10-CM | POA: Diagnosis not present

## 2016-03-17 DIAGNOSIS — Z0000 Encounter for general adult medical examination without abnormal findings: Secondary | ICD-10-CM | POA: Diagnosis not present

## 2016-03-17 DIAGNOSIS — E1140 Type 2 diabetes mellitus with diabetic neuropathy, unspecified: Secondary | ICD-10-CM | POA: Diagnosis not present

## 2016-03-20 DIAGNOSIS — I2541 Coronary artery aneurysm: Secondary | ICD-10-CM | POA: Diagnosis not present

## 2016-03-20 DIAGNOSIS — D649 Anemia, unspecified: Secondary | ICD-10-CM | POA: Diagnosis not present

## 2016-03-20 DIAGNOSIS — R0902 Hypoxemia: Secondary | ICD-10-CM | POA: Diagnosis not present

## 2016-03-30 DIAGNOSIS — I482 Chronic atrial fibrillation: Secondary | ICD-10-CM | POA: Diagnosis not present

## 2016-03-30 DIAGNOSIS — R0602 Shortness of breath: Secondary | ICD-10-CM | POA: Diagnosis not present

## 2016-04-13 DIAGNOSIS — E782 Mixed hyperlipidemia: Secondary | ICD-10-CM | POA: Diagnosis not present

## 2016-04-13 DIAGNOSIS — I482 Chronic atrial fibrillation: Secondary | ICD-10-CM | POA: Diagnosis not present

## 2016-04-13 DIAGNOSIS — E1140 Type 2 diabetes mellitus with diabetic neuropathy, unspecified: Secondary | ICD-10-CM | POA: Diagnosis not present

## 2016-04-13 DIAGNOSIS — Z794 Long term (current) use of insulin: Secondary | ICD-10-CM | POA: Diagnosis not present

## 2016-04-13 DIAGNOSIS — I4891 Unspecified atrial fibrillation: Secondary | ICD-10-CM | POA: Diagnosis not present

## 2016-04-13 DIAGNOSIS — I272 Pulmonary hypertension, unspecified: Secondary | ICD-10-CM | POA: Diagnosis not present

## 2016-04-13 DIAGNOSIS — I2510 Atherosclerotic heart disease of native coronary artery without angina pectoris: Secondary | ICD-10-CM | POA: Diagnosis not present

## 2016-04-13 DIAGNOSIS — I10 Essential (primary) hypertension: Secondary | ICD-10-CM | POA: Diagnosis not present

## 2016-04-20 DIAGNOSIS — I2541 Coronary artery aneurysm: Secondary | ICD-10-CM | POA: Diagnosis not present

## 2016-04-20 DIAGNOSIS — D649 Anemia, unspecified: Secondary | ICD-10-CM | POA: Diagnosis not present

## 2016-04-20 DIAGNOSIS — R0902 Hypoxemia: Secondary | ICD-10-CM | POA: Diagnosis not present

## 2016-04-30 DIAGNOSIS — R0602 Shortness of breath: Secondary | ICD-10-CM | POA: Diagnosis not present

## 2016-04-30 DIAGNOSIS — I482 Chronic atrial fibrillation: Secondary | ICD-10-CM | POA: Diagnosis not present

## 2016-05-21 DIAGNOSIS — D649 Anemia, unspecified: Secondary | ICD-10-CM | POA: Diagnosis not present

## 2016-05-21 DIAGNOSIS — I2541 Coronary artery aneurysm: Secondary | ICD-10-CM | POA: Diagnosis not present

## 2016-05-21 DIAGNOSIS — R0902 Hypoxemia: Secondary | ICD-10-CM | POA: Diagnosis not present

## 2016-05-31 DIAGNOSIS — I482 Chronic atrial fibrillation: Secondary | ICD-10-CM | POA: Diagnosis not present

## 2016-05-31 DIAGNOSIS — R0602 Shortness of breath: Secondary | ICD-10-CM | POA: Diagnosis not present

## 2016-06-15 DIAGNOSIS — Z79899 Other long term (current) drug therapy: Secondary | ICD-10-CM | POA: Diagnosis not present

## 2016-06-15 DIAGNOSIS — R0602 Shortness of breath: Secondary | ICD-10-CM | POA: Diagnosis not present

## 2016-06-15 DIAGNOSIS — G609 Hereditary and idiopathic neuropathy, unspecified: Secondary | ICD-10-CM | POA: Diagnosis not present

## 2016-06-15 DIAGNOSIS — E1121 Type 2 diabetes mellitus with diabetic nephropathy: Secondary | ICD-10-CM | POA: Diagnosis not present

## 2016-06-15 DIAGNOSIS — I482 Chronic atrial fibrillation: Secondary | ICD-10-CM | POA: Diagnosis not present

## 2016-06-15 DIAGNOSIS — E119 Type 2 diabetes mellitus without complications: Secondary | ICD-10-CM | POA: Diagnosis not present

## 2016-06-15 DIAGNOSIS — I272 Pulmonary hypertension, unspecified: Secondary | ICD-10-CM | POA: Diagnosis not present

## 2016-06-15 DIAGNOSIS — R42 Dizziness and giddiness: Secondary | ICD-10-CM | POA: Diagnosis not present

## 2016-06-15 DIAGNOSIS — E782 Mixed hyperlipidemia: Secondary | ICD-10-CM | POA: Diagnosis not present

## 2016-06-16 DIAGNOSIS — E119 Type 2 diabetes mellitus without complications: Secondary | ICD-10-CM | POA: Diagnosis not present

## 2016-06-16 DIAGNOSIS — R42 Dizziness and giddiness: Secondary | ICD-10-CM | POA: Diagnosis not present

## 2016-06-16 DIAGNOSIS — G609 Hereditary and idiopathic neuropathy, unspecified: Secondary | ICD-10-CM | POA: Diagnosis not present

## 2016-06-16 DIAGNOSIS — R0602 Shortness of breath: Secondary | ICD-10-CM | POA: Diagnosis not present

## 2016-06-16 DIAGNOSIS — E782 Mixed hyperlipidemia: Secondary | ICD-10-CM | POA: Diagnosis not present

## 2016-06-16 DIAGNOSIS — I272 Pulmonary hypertension, unspecified: Secondary | ICD-10-CM | POA: Diagnosis not present

## 2016-06-16 DIAGNOSIS — I482 Chronic atrial fibrillation: Secondary | ICD-10-CM | POA: Diagnosis not present

## 2016-06-16 DIAGNOSIS — E1121 Type 2 diabetes mellitus with diabetic nephropathy: Secondary | ICD-10-CM | POA: Diagnosis not present

## 2016-06-16 DIAGNOSIS — Z79899 Other long term (current) drug therapy: Secondary | ICD-10-CM | POA: Diagnosis not present

## 2016-06-18 DIAGNOSIS — R0902 Hypoxemia: Secondary | ICD-10-CM | POA: Diagnosis not present

## 2016-06-18 DIAGNOSIS — D649 Anemia, unspecified: Secondary | ICD-10-CM | POA: Diagnosis not present

## 2016-06-18 DIAGNOSIS — I2541 Coronary artery aneurysm: Secondary | ICD-10-CM | POA: Diagnosis not present

## 2016-06-28 DIAGNOSIS — I482 Chronic atrial fibrillation: Secondary | ICD-10-CM | POA: Diagnosis not present

## 2016-06-28 DIAGNOSIS — R0602 Shortness of breath: Secondary | ICD-10-CM | POA: Diagnosis not present

## 2016-07-11 DIAGNOSIS — H8111 Benign paroxysmal vertigo, right ear: Secondary | ICD-10-CM | POA: Diagnosis not present

## 2016-07-18 DIAGNOSIS — R296 Repeated falls: Secondary | ICD-10-CM | POA: Diagnosis not present

## 2016-07-18 DIAGNOSIS — R42 Dizziness and giddiness: Secondary | ICD-10-CM | POA: Diagnosis not present

## 2016-07-18 DIAGNOSIS — R2689 Other abnormalities of gait and mobility: Secondary | ICD-10-CM | POA: Diagnosis not present

## 2016-07-19 DIAGNOSIS — D649 Anemia, unspecified: Secondary | ICD-10-CM | POA: Diagnosis not present

## 2016-07-19 DIAGNOSIS — R0902 Hypoxemia: Secondary | ICD-10-CM | POA: Diagnosis not present

## 2016-07-19 DIAGNOSIS — I2541 Coronary artery aneurysm: Secondary | ICD-10-CM | POA: Diagnosis not present

## 2016-07-29 DIAGNOSIS — I482 Chronic atrial fibrillation: Secondary | ICD-10-CM | POA: Diagnosis not present

## 2016-07-29 DIAGNOSIS — R0602 Shortness of breath: Secondary | ICD-10-CM | POA: Diagnosis not present

## 2016-08-10 ENCOUNTER — Emergency Department: Payer: Medicare HMO

## 2016-08-10 ENCOUNTER — Inpatient Hospital Stay
Admission: EM | Admit: 2016-08-10 | Discharge: 2016-08-12 | DRG: 291 | Disposition: A | Discharge status: Home Health | Payer: Medicare HMO | Attending: Internal Medicine | Admitting: Internal Medicine

## 2016-08-10 ENCOUNTER — Encounter: Payer: Self-pay | Admitting: Emergency Medicine

## 2016-08-10 DIAGNOSIS — E119 Type 2 diabetes mellitus without complications: Secondary | ICD-10-CM | POA: Diagnosis not present

## 2016-08-10 DIAGNOSIS — Z8249 Family history of ischemic heart disease and other diseases of the circulatory system: Secondary | ICD-10-CM

## 2016-08-10 DIAGNOSIS — Z85038 Personal history of other malignant neoplasm of large intestine: Secondary | ICD-10-CM

## 2016-08-10 DIAGNOSIS — Z79899 Other long term (current) drug therapy: Secondary | ICD-10-CM | POA: Diagnosis not present

## 2016-08-10 DIAGNOSIS — Z7982 Long term (current) use of aspirin: Secondary | ICD-10-CM

## 2016-08-10 DIAGNOSIS — Z800 Family history of malignant neoplasm of digestive organs: Secondary | ICD-10-CM

## 2016-08-10 DIAGNOSIS — Z888 Allergy status to other drugs, medicaments and biological substances status: Secondary | ICD-10-CM | POA: Diagnosis not present

## 2016-08-10 DIAGNOSIS — J9621 Acute and chronic respiratory failure with hypoxia: Secondary | ICD-10-CM | POA: Diagnosis not present

## 2016-08-10 DIAGNOSIS — E1142 Type 2 diabetes mellitus with diabetic polyneuropathy: Secondary | ICD-10-CM | POA: Diagnosis not present

## 2016-08-10 DIAGNOSIS — I482 Chronic atrial fibrillation: Secondary | ICD-10-CM | POA: Diagnosis present

## 2016-08-10 DIAGNOSIS — I110 Hypertensive heart disease with heart failure: Secondary | ICD-10-CM | POA: Diagnosis not present

## 2016-08-10 DIAGNOSIS — Z885 Allergy status to narcotic agent status: Secondary | ICD-10-CM | POA: Diagnosis not present

## 2016-08-10 DIAGNOSIS — J811 Chronic pulmonary edema: Secondary | ICD-10-CM | POA: Diagnosis not present

## 2016-08-10 DIAGNOSIS — I5033 Acute on chronic diastolic (congestive) heart failure: Secondary | ICD-10-CM | POA: Diagnosis present

## 2016-08-10 DIAGNOSIS — Z9981 Dependence on supplemental oxygen: Secondary | ICD-10-CM

## 2016-08-10 DIAGNOSIS — Z90710 Acquired absence of both cervix and uterus: Secondary | ICD-10-CM

## 2016-08-10 DIAGNOSIS — F419 Anxiety disorder, unspecified: Secondary | ICD-10-CM | POA: Diagnosis present

## 2016-08-10 DIAGNOSIS — I4891 Unspecified atrial fibrillation: Secondary | ICD-10-CM | POA: Diagnosis not present

## 2016-08-10 DIAGNOSIS — J90 Pleural effusion, not elsewhere classified: Secondary | ICD-10-CM | POA: Diagnosis not present

## 2016-08-10 DIAGNOSIS — M7989 Other specified soft tissue disorders: Secondary | ICD-10-CM | POA: Diagnosis present

## 2016-08-10 DIAGNOSIS — I2510 Atherosclerotic heart disease of native coronary artery without angina pectoris: Secondary | ICD-10-CM | POA: Diagnosis not present

## 2016-08-10 DIAGNOSIS — Z794 Long term (current) use of insulin: Secondary | ICD-10-CM | POA: Diagnosis not present

## 2016-08-10 DIAGNOSIS — G4733 Obstructive sleep apnea (adult) (pediatric): Secondary | ICD-10-CM | POA: Diagnosis present

## 2016-08-10 DIAGNOSIS — E7800 Pure hypercholesterolemia, unspecified: Secondary | ICD-10-CM | POA: Diagnosis present

## 2016-08-10 DIAGNOSIS — Z6831 Body mass index (BMI) 31.0-31.9, adult: Secondary | ICD-10-CM

## 2016-08-10 DIAGNOSIS — I5031 Acute diastolic (congestive) heart failure: Secondary | ICD-10-CM | POA: Diagnosis not present

## 2016-08-10 DIAGNOSIS — I272 Pulmonary hypertension, unspecified: Secondary | ICD-10-CM | POA: Diagnosis present

## 2016-08-10 DIAGNOSIS — I509 Heart failure, unspecified: Secondary | ICD-10-CM

## 2016-08-10 DIAGNOSIS — Z951 Presence of aortocoronary bypass graft: Secondary | ICD-10-CM

## 2016-08-10 DIAGNOSIS — I10 Essential (primary) hypertension: Secondary | ICD-10-CM | POA: Diagnosis not present

## 2016-08-10 DIAGNOSIS — I2541 Coronary artery aneurysm: Secondary | ICD-10-CM | POA: Diagnosis not present

## 2016-08-10 HISTORY — DX: Unspecified atrial fibrillation: I48.91

## 2016-08-10 HISTORY — DX: Polyneuropathy, unspecified: G62.9

## 2016-08-10 HISTORY — DX: Pulmonary hypertension, unspecified: I27.20

## 2016-08-10 HISTORY — DX: Pure hypercholesterolemia, unspecified: E78.00

## 2016-08-10 HISTORY — DX: Atherosclerotic heart disease of native coronary artery without angina pectoris: I25.10

## 2016-08-10 LAB — GLUCOSE, CAPILLARY
Glucose-Capillary: 106 mg/dL — ABNORMAL HIGH (ref 65–99)
Glucose-Capillary: 147 mg/dL — ABNORMAL HIGH (ref 65–99)

## 2016-08-10 LAB — BASIC METABOLIC PANEL
Anion gap: 9 (ref 5–15)
BUN: 15 mg/dL (ref 6–20)
CHLORIDE: 102 mmol/L (ref 101–111)
CO2: 25 mmol/L (ref 22–32)
CREATININE: 1.07 mg/dL — AB (ref 0.44–1.00)
Calcium: 8.6 mg/dL — ABNORMAL LOW (ref 8.9–10.3)
GFR calc non Af Amer: 45 mL/min — ABNORMAL LOW (ref >60)
GFR, EST AFRICAN AMERICAN: 52 mL/min — AB (ref >60)
GLUCOSE: 170 mg/dL — AB (ref 65–99)
Potassium: 5.1 mmol/L (ref 3.5–5.1)
Sodium: 136 mmol/L (ref 135–145)

## 2016-08-10 LAB — CBC
HCT: 41.2 % (ref 35.0–47.0)
Hemoglobin: 13 g/dL (ref 12.0–16.0)
MCH: 26.7 pg (ref 26.0–34.0)
MCHC: 31.5 g/dL — ABNORMAL LOW (ref 32.0–36.0)
MCV: 84.9 fL (ref 80.0–100.0)
PLATELETS: 167 10*3/uL (ref 150–440)
RBC: 4.86 MIL/uL (ref 3.80–5.20)
RDW: 17.4 % — ABNORMAL HIGH (ref 11.5–14.5)
WBC: 4.5 10*3/uL (ref 3.6–11.0)

## 2016-08-10 LAB — HEPATIC FUNCTION PANEL
ALT: 13 U/L — ABNORMAL LOW (ref 14–54)
AST: 30 U/L (ref 15–41)
Albumin: 3.6 g/dL (ref 3.5–5.0)
Alkaline Phosphatase: 87 U/L (ref 38–126)
BILIRUBIN DIRECT: 0.3 mg/dL (ref 0.1–0.5)
BILIRUBIN TOTAL: 0.9 mg/dL (ref 0.3–1.2)
Indirect Bilirubin: 0.6 mg/dL (ref 0.3–0.9)
Total Protein: 7 g/dL (ref 6.5–8.1)

## 2016-08-10 LAB — BRAIN NATRIURETIC PEPTIDE: B Natriuretic Peptide: 459 pg/mL — ABNORMAL HIGH (ref 0.0–100.0)

## 2016-08-10 LAB — TROPONIN I: Troponin I: <0.03 ng/mL (ref <0.03)

## 2016-08-10 MED ORDER — FUROSEMIDE 10 MG/ML IJ SOLN
40.0000 mg | Freq: Once | INTRAMUSCULAR | Status: AC
  Administered 2016-08-10: 40 mg via INTRAVENOUS
  Filled 2016-08-10: qty 4

## 2016-08-10 MED ORDER — ASPIRIN EC 325 MG PO TBEC
325.0000 mg | DELAYED_RELEASE_TABLET | Freq: Every day | ORAL | Status: DC
Start: 2016-08-11 — End: 2016-08-12
  Administered 2016-08-11 – 2016-08-12 (×2): 325 mg via ORAL
  Filled 2016-08-10 (×2): qty 1

## 2016-08-10 MED ORDER — FUROSEMIDE 10 MG/ML IJ SOLN
40.0000 mg | Freq: Two times a day (BID) | INTRAMUSCULAR | Status: DC
  Administered 2016-08-11: 40 mg via INTRAVENOUS
  Filled 2016-08-10: qty 4

## 2016-08-10 MED ORDER — INSULIN ASPART 100 UNIT/ML ~~LOC~~ SOLN
0.0000 [IU] | Freq: Every day | SUBCUTANEOUS | Status: DC
  Filled 2016-08-10: qty 1

## 2016-08-10 MED ORDER — ONDANSETRON HCL 4 MG/2ML IJ SOLN: 4.0000 mg | Freq: Four times a day (QID) | INTRAMUSCULAR | Status: DC | PRN

## 2016-08-10 MED ORDER — SODIUM CHLORIDE 0.9% FLUSH
3.0000 mL | Freq: Two times a day (BID) | INTRAVENOUS | Status: DC
  Administered 2016-08-10 – 2016-08-12 (×4): 3 mL via INTRAVENOUS

## 2016-08-10 MED ORDER — ACETAMINOPHEN 325 MG PO TABS
650.0000 mg | ORAL_TABLET | Freq: Four times a day (QID) | ORAL | Status: DC | PRN
  Filled 2016-08-10: qty 2

## 2016-08-10 MED ORDER — INSULIN ASPART 100 UNIT/ML ~~LOC~~ SOLN
0.0000 [IU] | Freq: Three times a day (TID) | SUBCUTANEOUS | Status: DC
  Administered 2016-08-11: 1 [IU] via SUBCUTANEOUS

## 2016-08-10 MED ORDER — ACETAMINOPHEN 650 MG RE SUPP: 650.0000 mg | Freq: Four times a day (QID) | RECTAL | Status: DC | PRN

## 2016-08-10 MED ORDER — ONDANSETRON HCL 4 MG PO TABS: 4.0000 mg | ORAL_TABLET | Freq: Four times a day (QID) | ORAL | Status: DC | PRN

## 2016-08-10 MED ORDER — METOPROLOL TARTRATE 25 MG PO TABS
12.5000 mg | ORAL_TABLET | Freq: Two times a day (BID) | ORAL | Status: DC
  Administered 2016-08-10 – 2016-08-12 (×4): 12.5 mg via ORAL
  Filled 2016-08-10 (×4): qty 1

## 2016-08-10 MED ORDER — INSULIN ASPART PROT & ASPART (70-30 MIX) 100 UNIT/ML ~~LOC~~ SUSP
20.0000 [IU] | Freq: Two times a day (BID) | SUBCUTANEOUS | Status: DC
  Administered 2016-08-10: 20 [IU] via SUBCUTANEOUS
  Filled 2016-08-10: qty 20

## 2016-08-10 MED ORDER — ENOXAPARIN SODIUM 40 MG/0.4ML ~~LOC~~ SOLN
40.0000 mg | SUBCUTANEOUS | Status: DC
  Administered 2016-08-10 – 2016-08-11 (×2): 40 mg via SUBCUTANEOUS
  Filled 2016-08-10 (×2): qty 0.4

## 2016-08-10 MED ORDER — PAROXETINE HCL 20 MG PO TABS
20.0000 mg | ORAL_TABLET | Freq: Every day | ORAL | Status: DC
Start: 2016-08-11 — End: 2016-08-12
  Administered 2016-08-11 – 2016-08-12 (×2): 20 mg via ORAL
  Filled 2016-08-10 (×2): qty 1

## 2016-08-10 MED ORDER — GLIPIZIDE 5 MG PO TABS
5.0000 mg | ORAL_TABLET | Freq: Two times a day (BID) | ORAL | Status: DC
  Administered 2016-08-10: 5 mg via ORAL
  Filled 2016-08-10 (×2): qty 1

## 2016-08-10 MED ORDER — TRAZODONE HCL 50 MG PO TABS
25.0000 mg | ORAL_TABLET | Freq: Every day | ORAL | Status: DC
  Administered 2016-08-10 – 2016-08-11 (×2): 25 mg via ORAL
  Filled 2016-08-10 (×2): qty 1

## 2016-08-10 NOTE — ED Notes (Signed)
Called lab to make sure they could add on the labs Dr. Darl Householder wants to add on. They stated they could do it.

## 2016-08-10 NOTE — Plan of Care (Signed)
Problem: Safety: Goal: Ability to remain free from injury will improve Outcome: Progressing Pt will be encouraged to call for assistance with activity.

## 2016-08-10 NOTE — Plan of Care (Signed)
Problem: Education: Goal: Knowledge of [facility] Education information/materials will improve Outcome: Progressing Discussed fall precautions and pt signed fall contract.   Problem: Safety: Goal: Ability to remain free from injury will improve Outcome: Progressing Exit alarm activated, Pt encouraged to call out for assistance with activity.   Problem: Pain Managment: Goal: General experience of comfort will improve Outcome: Progressing Oxygen will be maintained per MD order. Pt is 2L chronic at hour of sleep.  Problem: Activity: Goal: Risk for activity intolerance will decrease Outcome: Progressing Pt will use BSC until shortness of breath is back to baseline.   Problem: Fluid Volume: Goal: Ability to maintain a balanced intake and output will improve Outcome: Progressing We will monitor patient's weight and monitor I&Os daily.

## 2016-08-10 NOTE — ED Triage Notes (Signed)
Patient presents to the ED with increased shortness of breath x 3-4 weeks with increased swelling to legs bilaterally today.  Patient states, "I'm more swollen than I've ever been."  Patient states I don't like to lie back because then I get really short of breath.  Patient is unsure whether or not she has history of congestive heart failure.  Patient states she takes lasix every other day but doesn't like to take it.

## 2016-08-10 NOTE — ED Provider Notes (Signed)
[facility] Provider Note   CSN: [id number] Arrival date & time: 08/10/16  1408     History   Chief Complaint Chief Complaint  Patient presents with  . Shortness of Breath  . Leg Swelling    HPI Pamela Chaney is a 83 y.o. female history of atrial fibrillation but not on blood thinners, CAD with CABG, diabetes here presenting with shortness of breath. Patient is Using 2 L nasal cannula at nighttime only. Over the last week or so, patient noticed that she required more oxygen and had to use it during the day. She also had progressive leg swelling and orthopnea. She went to the clinic today and was noted to be 85% on room air and was put on 2 L and sent to the ER. Patient states that she had bypass surgery done at [facility] in 2010 and follows with Dr. Ubaldo Glassing.   The history is provided by the patient.    Past Medical History:  Diagnosis Date  . Atrial fibrillation ([place])   . Cancer ([place])    colon  . Coronary artery disease   . Diabetes mellitus without complication ([place])   . High cholesterol   . Hypertension   . Peripheral neuropathy   . Pulmonary hypertension ([place])     There are no active problems to display for this patient.   Past Surgical History:  Procedure Laterality Date  . ABDOMINAL HYSTERECTOMY    . EXPLORATION POST OPERATIVE OPEN HEART      OB History    No data available       Home Medications    Prior to Admission medications   Medication Sig Start Date End Date Taking? Authorizing Provider  acetaminophen (TYLENOL) 500 MG tablet Take 1 tablet by mouth every 6 (six) hours as needed.    Historical Provider, MD  aspirin EC 81 MG tablet Take 1 tablet by mouth daily.    Historical Provider, MD  glipiZIDE (GLUCOTROL) 5 MG tablet Take 1 tablet by mouth 2 (two) times daily. 06/17/14   Historical Provider, MD  HUMULIN 70/30 KWIKPEN (70-30) 100 UNIT/ML PEN Inject 20 Units into the skin 2 (two) times daily.  08/31/14   Historical Provider, MD  metoprolol  tartrate (LOPRESSOR) 25 MG tablet Take 1 tablet by mouth daily. 08/31/14   Historical Provider, MD  PARoxetine (PAXIL) 20 MG tablet Take 1 tablet by mouth daily. 08/31/14   Historical Provider, MD  Simethicone (GAS RELIEF PO) Take 1 tablet by mouth daily as needed.  09/17/14   Historical Provider, MD    Family History No family history on file.  Social History Social History  Substance Use Topics  . Smoking status: Never Smoker  . Smokeless tobacco: Never Used  . Alcohol use No     Allergies   Lisinopril and Morphine and related   Review of Systems Review of Systems  Respiratory: Positive for shortness of breath.   Cardiovascular: Positive for leg swelling.  All other systems reviewed and are negative.    Physical Exam Updated Vital Signs BP (!) 169/93   Pulse 93   Temp 98.8 F (37.1 C) (Oral)   Resp (!) 25   Ht 5' 8.5" (1.74 m)   Wt 210 lb (95.3 kg)   SpO2 98%   BMI 31.47 kg/m   Physical Exam  Constitutional: She is oriented to person, place, and time.  Chronically ill   HENT:  Head: Normocephalic.  Mouth/Throat: Oropharynx is clear and moist.  Eyes: EOM are  normal. Pupils are equal, round, and reactive to light.  Neck: Normal range of motion. Neck supple.  Cardiovascular: Normal rate, regular rhythm and normal heart sounds.   Pulmonary/Chest: Effort normal.  Crackles bilateral bases   Abdominal: Soft. Bowel sounds are normal. She exhibits no distension. There is no tenderness. There is no guarding.  Musculoskeletal: Normal range of motion.  2+ edema bilateral legs, no calf tenderness   Neurological: She is alert and oriented to person, place, and time. No cranial nerve deficit. Coordination normal.  Skin: Skin is warm.  Psychiatric: She has a normal mood and affect.  Nursing note and vitals reviewed.    ED Treatments / Results  Labs (all labs ordered are listed, but only abnormal results are displayed) Labs Reviewed  BASIC METABOLIC PANEL - Abnormal;  Notable for the following:       Result Value   Glucose, Bld 170 (*)    Creatinine, Ser 1.07 (*)    Calcium 8.6 (*)    GFR calc non Af Amer 45 (*)    GFR calc Af Amer 52 (*)    All other components within normal limits  CBC - Abnormal; Notable for the following:    MCHC 31.5 (*)    RDW 17.4 (*)    All other components within normal limits  HEPATIC FUNCTION PANEL - Abnormal; Notable for the following:    ALT 13 (*)    All other components within normal limits  TROPONIN I  BRAIN NATRIURETIC PEPTIDE    EKG  EKG Interpretation  Date/Time:  Thursday August 10 2016 14:35:33 EDT Ventricular Rate:  88 PR Interval:    QRS Duration: 84 QT Interval:  390 QTC Calculation: 471 R Axis:   -97 Text Interpretation:  Atrial fibrillation Inferior infarct , age undetermined Anterolateral infarct , age undetermined Abnormal ECG When compared with ECG of 18-Nov-2014 09:56, Significant changes have occurred Confirmed by YAO  MD, DAVID ([id number]) on 08/10/2016 3:03:13 PM       Radiology Dg Chest 2 View  Result Date: 08/10/2016 CLINICAL DATA:  83 year old female with a history of shortness of breath EXAM: CHEST  2 VIEW COMPARISON:  11/18/2014 FINDINGS: Cardiomediastinal silhouette unchanged in size. Fullness in the central vasculature. Mild interlobular septal thickening. Linear opacities at the bilateral lung bases, more pronounced on the left. Blunting of the left costophrenic angle. Meniscus on the lateral view involving bilateral costophrenic sulci. Surgical changes of median sternotomy and CABG. Calcifications of the aortic arch. No confluent airspace disease. IMPRESSION: Evidence of early pulmonary edema and small left greater than right pleural effusions. No evidence of lobar pneumonia. Aortic atherosclerosis with surgical changes of prior median sternotomy and CABG Electronically Signed   By: Corrie Mckusick D.O.   On: 08/10/2016 15:10    Procedures Procedures (including critical care  time)  Medications Ordered in ED Medications  furosemide (LASIX) injection 40 mg (40 mg Intravenous Given 08/10/16 1515)     Initial Impression / Assessment and Plan / ED Course  I have reviewed the triage vital signs and the nursing notes.  Pertinent labs & imaging results that were available during my care of the patient were reviewed by me and considered in my medical decision making (see chart for details).    Apryl Brymer Warwick is a 83 y.o. female here with SOB, leg swelling. She has been requiring more oxygen and is hypoxic 85% on RA. Appears volume overloaded. Will get labs, BNP, trop, CXR. Will likely need admission for diuresis  given hypoxia and increased oxygen requirement.    3:49 PM CXR showed pulmonary edema. Given lasix 40 mg IV. Will admit.     Final Clinical Impressions(s) / ED Diagnoses   Final diagnoses:  None    New Prescriptions New Prescriptions   No medications on file     Drenda Freeze, MD 08/10/16 1550

## 2016-08-10 NOTE — ED Notes (Signed)
Ambulated pt to bathroom on oxygen. Pt became SOB with movement but did well. Urinated a small amount into toilet. Sat back in bed.  Pt and daughter worried about a "83 year old wound" to R shin. It appears red and pt states last week it was weepy. They would like it addressed while in hospital.

## 2016-08-10 NOTE — ED Notes (Signed)
Pt has been c/o increasing SOB over past week. States "I have no breath when I move." pt wears oxygen at night, states had to use it this AM. When placed on monitor in room pt was 85% on RA, placed on 2 L nasal cannula and came up to 95%. Pt wears 2 L at night. Pt also c/o bilat leg swelling. Pt feet are purple, family states they look better now than before. Pt states circulation issues. Unsure of CHF, states she does take lasix every few days. States COPD and hx DVT.

## 2016-08-10 NOTE — H&P (Signed)
[facility] [place] NAME: Pamela Chaney    MR#:  [id number]  DATE OF BIRTH:  [birth year]  DATE OF ADMISSION:  08/10/2016  PRIMARY CARE PHYSICIAN: Idelle Crouch, MD   REQUESTING/REFERRING PHYSICIAN: Dr. Shirlyn Goltz  CHIEF COMPLAINT:   Chief Complaint  Patient presents with  . Shortness of Breath  . Leg Swelling    HISTORY OF PRESENT ILLNESS:  Pamela Chaney  is a 83 y.o. female with a known history of Atrial fibrillation, colon cancer, diabetes, hypertension, pulmonary hypertension, peripheral neuropathy, coronary artery disease who presents to the hospital due to shortness of breath and worsening lower extremity edema. Patient says that she has noticed worsening lower extremity edema now for the past 3-4 weeks. She is on oxygen at home only at bedtime but for the past few days she's had to use it more frequently even at exertion because of worsening shortness of breath. She denies any orthopnea or paroxysmal nocturnal dyspnea. She is on Lasix as needed for CHF and has been using it every other day without much improvement in her symptoms or her swelling of her legs. She went to urgent care and was referred to the ER and was noted to be in congestive heart failure and hospitalist services were contacted further treatment and evaluation. Patient denies any chest pain, nausea, vomiting, abdominal pain, fever, chills, cough or any other associated symptoms presently.  PAST MEDICAL HISTORY:   Past Medical History:  Diagnosis Date  . Atrial fibrillation ([place])   . Cancer ([place])    colon  . Coronary artery disease   . Diabetes mellitus without complication ([place])   . High cholesterol   . Hypertension   . Peripheral neuropathy   . Pulmonary hypertension ([place])     PAST SURGICAL HISTORY:   Past Surgical History:  Procedure Laterality Date  . ABDOMINAL HYSTERECTOMY    . EXPLORATION POST OPERATIVE OPEN HEART      SOCIAL HISTORY:   Social  History  Substance Use Topics  . Smoking status: Never Smoker  . Smokeless tobacco: Never Used  . Alcohol use No    FAMILY HISTORY:   Family History  Problem Relation Age of Onset  . Pancreatic cancer Mother   . Heart attack Father   . Colon cancer Sister     DRUG ALLERGIES:   Allergies  Allergen Reactions  . Lisinopril Other (See Comments)    Causes weakness.  . Morphine And Related Other (See Comments)    Hallucinations    REVIEW OF SYSTEMS:   Review of Systems  Constitutional: Negative for fever and weight loss.  HENT: Negative for congestion, nosebleeds and tinnitus.   Eyes: Negative for blurred vision, double vision and redness.  Respiratory: Positive for shortness of breath. Negative for cough and hemoptysis.   Cardiovascular: Positive for leg swelling. Negative for chest pain, orthopnea and PND.  Gastrointestinal: Negative for abdominal pain, diarrhea, melena, nausea and vomiting.  Genitourinary: Negative for dysuria, hematuria and urgency.  Musculoskeletal: Negative for falls and joint pain.  Neurological: Negative for dizziness, tingling, sensory change, focal weakness, seizures, weakness and headaches.  Endo/Heme/Allergies: Negative for polydipsia. Does not bruise/bleed easily.  Psychiatric/Behavioral: Negative for depression and memory loss. The patient is not nervous/anxious.     MEDICATIONS AT HOME:   Prior to Admission medications   Medication Sig Start Date End Date Taking? Authorizing Provider  acetaminophen (TYLENOL) 500 MG tablet Take 1 tablet by mouth every 6 (six) hours as  needed.   Yes Historical Provider, MD  aspirin EC 325 MG tablet Take 325 tablets by mouth daily.    Yes Historical Provider, MD  furosemide (LASIX) 20 MG tablet Take 20 mg by mouth daily as needed for edema. 10/14/15 10/13/16 Yes Historical Provider, MD  glipiZIDE (GLUCOTROL) 5 MG tablet Take 1 tablet by mouth 2 (two) times daily. 06/17/14  Yes Historical Provider, MD  HUMULIN 70/30  KWIKPEN (70-30) 100 UNIT/ML PEN Inject 20 Units into the skin 2 (two) times daily.  08/31/14  Yes Historical Provider, MD  metoprolol tartrate (LOPRESSOR) 25 MG tablet Take 12.5 mg by mouth 2 (two) times daily.  08/31/14  Yes Historical Provider, MD  PARoxetine (PAXIL) 20 MG tablet Take 20 mg by mouth daily.  08/31/14  Yes Historical Provider, MD  Simethicone (GAS RELIEF PO) Take 1 tablet by mouth daily as needed.  09/17/14  Yes Historical Provider, MD  traZODone (DESYREL) 50 MG tablet Take 25 mg by mouth daily. 09/15/15  Yes Historical Provider, MD      VITAL SIGNS:  Blood pressure (!) 169/93, pulse 93, temperature 98.8 F (37.1 C), temperature source Oral, resp. rate (!) 25, height 5' 8.5" (1.74 m), weight 95.3 kg (210 lb), SpO2 98 %.  PHYSICAL EXAMINATION:  Physical Exam  GENERAL:  83 y.o.-year-old patient lying in bed in no acute distress.  EYES: Pupils equal, round, reactive to light and accommodation. No scleral icterus. Extraocular muscles intact.  HEENT: Head atraumatic, normocephalic. Oropharynx and nasopharynx clear. No oropharyngeal erythema, moist oral mucosa  NECK:  Supple, no jugular venous distention. No thyroid enlargement, no tenderness.  LUNGS: Normal breath sounds bilaterally, no wheezing, rhonchi, bibasilar Rales. No use of accessory muscles of respiration.  CARDIOVASCULAR: S1, S2 RRR. No murmurs, rubs, gallops, clicks.  ABDOMEN: Soft, nontender, nondistended. Bowel sounds present. No organomegaly or mass.  EXTREMITIES: + 2 edema b/l, no cyanosis, or clubbing. + 2 pedal & radial pulses b/l.  Signs of chronic venous stasis bilaterally. NEUROLOGIC: Cranial nerves II through XII are intact. No focal Motor or sensory deficits appreciated b/l/  globally weak. PSYCHIATRIC: The patient is alert and oriented x 3.  SKIN: No obvious rash, lesion, or ulcer.   LABORATORY PANEL:   CBC  Recent Labs Lab 08/10/16 1432  WBC 4.5  HGB 13.0  HCT 41.2  PLT 167    ------------------------------------------------------------------------------------------------------------------  Chemistries   Recent Labs Lab 08/10/16 1432  NA 136  K 5.1  CL 102  CO2 25  GLUCOSE 170*  BUN 15  CREATININE 1.07*  CALCIUM 8.6*  AST 30  ALT 13*  ALKPHOS 87  BILITOT 0.9   ------------------------------------------------------------------------------------------------------------------  Cardiac Enzymes  Recent Labs Lab 08/10/16 1432  TROPONINI <0.03   ------------------------------------------------------------------------------------------------------------------  RADIOLOGY:  Dg Chest 2 View  Result Date: 08/10/2016 CLINICAL DATA:  83 year old female with a history of shortness of breath EXAM: CHEST  2 VIEW COMPARISON:  11/18/2014 FINDINGS: Cardiomediastinal silhouette unchanged in size. Fullness in the central vasculature. Mild interlobular septal thickening. Linear opacities at the bilateral lung bases, more pronounced on the left. Blunting of the left costophrenic angle. Meniscus on the lateral view involving bilateral costophrenic sulci. Surgical changes of median sternotomy and CABG. Calcifications of the aortic arch. No confluent airspace disease. IMPRESSION: Evidence of early pulmonary edema and small left greater than right pleural effusions. No evidence of lobar pneumonia. Aortic atherosclerosis with surgical changes of prior median sternotomy and CABG Electronically Signed   By: Corrie Mckusick D.O.   On:  08/10/2016 15:10     IMPRESSION AND PLAN:   83 year old female with past medical history of chronic atrial fibrillation, pulmonary hypertension, essential hypertension, history of colon cancer, femoral neuropathy, diabetes, coronary artery disease who presents to the hospital due to shortness of breath and worsening lower extremity edema.  1. CHF-acute on chronic diastolic dysfunction. This is the cause of patient's worsening shortness of breath  and lower extremity edema. -We'll diurese with IV Lasix, follow I's and O's and daily weights. -Continue metoprolol, we'll get a two-dimensional echocardiogram, get a cardiology consult.  2. Diabetes type 2 without complication-continue Novolin 70/30. Continue glipizide, will place on sliding scale insulin, carb-controlled diet. Follow blood sugars.  3. Essential hypertension-continue metoprolol.  4. Anxiety-continue Paxil.  5. History of chronic atrial fibrillation-rate controlled, continue metoprolol. -Continue aspirin. Patient is not on long-term anticoagulation as she has been intolerant to both Xarelto, Eliquis due to Wheezing, shortness of breath.      All the records are reviewed and case discussed with ED provider. Management plans discussed with the patient, family and they are in agreement.  CODE STATUS: Full code  TOTAL TIME TAKING CARE OF THIS PATIENT: 45 minutes.    Henreitta Leber M.D on 08/10/2016 at 4:24 PM  Between 7am to 6pm - Pager - [phone]  After 6pm go to [email or web address] - password [email or web address] [facility]  Office  [phone]  CC: Primary care physician; Idelle Crouch, MD

## 2016-08-10 NOTE — ED Notes (Signed)
Pt returned from xray via stretcher.

## 2016-08-11 ENCOUNTER — Inpatient Hospital Stay
Admit: 2016-08-11 | Discharge: 2016-08-11 | Disposition: A | Discharge status: Home / Self Care | Payer: Medicare HMO | Attending: Specialist | Admitting: Specialist

## 2016-08-11 LAB — BASIC METABOLIC PANEL
ANION GAP: 6 (ref 5–15)
BUN: 19 mg/dL (ref 6–20)
CALCIUM: 8.7 mg/dL — AB (ref 8.9–10.3)
CO2: 31 mmol/L (ref 22–32)
CREATININE: 1.26 mg/dL — AB (ref 0.44–1.00)
Chloride: 104 mmol/L (ref 101–111)
GFR calc Af Amer: 43 mL/min — ABNORMAL LOW (ref >60)
GFR calc non Af Amer: 37 mL/min — ABNORMAL LOW (ref >60)
GLUCOSE: 63 mg/dL — AB (ref 65–99)
Potassium: 4 mmol/L (ref 3.5–5.1)
Sodium: 141 mmol/L (ref 135–145)

## 2016-08-11 LAB — CBC
HCT: 39 % (ref 35.0–47.0)
HEMOGLOBIN: 12.5 g/dL (ref 12.0–16.0)
MCH: 27 pg (ref 26.0–34.0)
MCHC: 32 g/dL (ref 32.0–36.0)
MCV: 84.5 fL (ref 80.0–100.0)
Platelets: 165 10*3/uL (ref 150–440)
RBC: 4.61 MIL/uL (ref 3.80–5.20)
RDW: 17.2 % — AB (ref 11.5–14.5)
WBC: 4.1 10*3/uL (ref 3.6–11.0)

## 2016-08-11 LAB — GLUCOSE, CAPILLARY
GLUCOSE-CAPILLARY: 63 mg/dL — AB (ref 65–99)
GLUCOSE-CAPILLARY: 74 mg/dL (ref 65–99)
Glucose-Capillary: 51 mg/dL — ABNORMAL LOW (ref 65–99)
Glucose-Capillary: 85 mg/dL (ref 65–99)
Glucose-Capillary: 131 mg/dL — ABNORMAL HIGH (ref 65–99)
Glucose-Capillary: 150 mg/dL — ABNORMAL HIGH (ref 65–99)

## 2016-08-11 MED ORDER — INSULIN DETEMIR 100 UNIT/ML ~~LOC~~ SOLN
7.0000 [IU] | Freq: Two times a day (BID) | SUBCUTANEOUS | Status: DC
  Administered 2016-08-11 – 2016-08-12 (×2): 7 [IU] via SUBCUTANEOUS
  Filled 2016-08-11 (×4): qty 0.07

## 2016-08-11 MED ORDER — FUROSEMIDE 10 MG/ML IJ SOLN: 40.0000 mg | Freq: Every day | INTRAMUSCULAR | Status: DC

## 2016-08-11 MED ORDER — LIVING WELL WITH DIABETES BOOK
Freq: Once | Status: AC
  Administered 2016-08-12: 08:00:00
  Filled 2016-08-11: qty 1

## 2016-08-11 NOTE — Progress Notes (Addendum)
Met with patient, her daughter and family regarding diabetes.  Patient reveals she remembers to take her insulin bid but then reports she sometimes doesn't eat much (2 clementines for supper , 1 oatmeal cookie for breakfast).  Discussed the need to eat 3 meals per day and an hs snack- demonstrated how insulin works and the seriousness of eating to prevent low blood sugars.  Reviewed the treatment of low blood sugars using 4 oz of orange juice.  I also discussed meal planning using the picture of a plate- strongly encouraged to eat 3 carbohydrate servings and 1 protein at each meal.   Family receptive.  I have ordered a Living Well with diabetes book for the patient. I have also left the [facility] 1-800- nurse number with the patient and encouraged her to reach out to them if she has any medical questions while she is at home.   Gentry Fitz, RN, BA, MHA, CDE Diabetes Coordinator Inpatient Diabetes Program  [phone] (Team Pager) [phone] ([place]) 08/11/2016 1:37 PM

## 2016-08-11 NOTE — Progress Notes (Signed)
Inpatient Diabetes Program Recommendations  AACE/ADA: New Consensus Statement on Inpatient Glycemic Control (2015)  Target Ranges:  Prepandial:   less than 140 mg/dL      Peak postprandial:   less than 180 mg/dL (1-2 hours)      Critically ill patients:  140 - 180 mg/dL   Lab Results  Component Value Date   GLUCAP 74 08/11/2016    Review of Glycemic Control  Results for [name], Pamela Chaney (MRN [id number]) as of 08/11/2016 09:42  Ref. Range 08/10/2016 18:21 08/10/2016 20:54 08/11/2016 07:38 08/11/2016 07:56 08/11/2016 08:16  Glucose-Capillary Latest Ref Range: 65 - 99 mg/dL 106 (H) 147 (H) 51 (L) 63 (L) 74    Diabetes history: Type 2 Outpatient Diabetes medications: Glucotrol 5mg  bid, Humulin 70/30 20 units bid Current orders for Inpatient glycemic control: Glucotrol 5mg  bid, Humulin 70/30 20 units bid, Novolog sensitive correction 0-9 units tid, Novolog 0-5 units qhs  Inpatient Diabetes Program Recommendations:  Please d/c Glucotrol and Novolog 70/30 while inpatient- low CBG this am.  Consider Levemir 7 units bid (this is half of her home basal amount).  Continue Novolog sensitive correction tid and hs.   Gentry Fitz, RN, BA, MHA, CDE Diabetes Coordinator Inpatient Diabetes Program  [phone] (Team Pager) [phone] ([place]) 08/11/2016 9:47 AM

## 2016-08-11 NOTE — Progress Notes (Signed)
Hypoglycemic Event  CBG: 51  Treatment: 4 oz juice  Symptoms: asymptomatic   Follow-up CBG: Time:0755 CBG Result:63  Possible Reasons for Event: unknown  Comments/MD notified:  followed protocol for hypoglycemia, juice given x2, last CBG at 0815 is 74. Pt is eating breakfast. I will continue to assess.     Margarita Mail

## 2016-08-11 NOTE — Consult Note (Signed)
[facility] Cardiology  CARDIOLOGY CONSULT NOTE  Patient ID: Pamela Chaney MRN: [id number] DOB/AGE: 06/03/1925 83 y.o.  Admit date: 08/10/2016 Referring Physician Verdell Carmine Primary Physician [facility] Primary Cardiologist Fath Reason for Consultation Acute on chronic diastolic heart failure  HPI: 83 year old female referred for acute on chronic diastolic heart failure. Patient has a history of coronary artery disease status post CABG 2010 at [facility], chronic atrial fibrillation on aspirin, not on chronic anticoagulation due to intolerance to Xarelto and Eliquis, type 2 diabetes, hypertension, pulmonary hypertension, and OSA. Patient presented to [facility] ER yesterday for progressive lower extremity edema, orthopnea, shortness of breath and hypoxia. Admission labs notable for troponin <0.03. Chest x-ray revealed evidence of pulmonary edema and small left greater than right pleural effusions. Patient received IV Lasix and was placed on oxygen and reports overall improvement. She denies chest pain. She states that her breathing and peripheral edema have improved.  Review of systems complete and found to be negative unless listed above     Past Medical History:  Diagnosis Date  . Atrial fibrillation ([place])   . Cancer ([place])    colon  . Coronary artery disease   . Diabetes mellitus without complication ([place])   . High cholesterol   . Hypertension   . Peripheral neuropathy   . Pulmonary hypertension ([place])     Past Surgical History:  Procedure Laterality Date  . ABDOMINAL HYSTERECTOMY    . EXPLORATION POST OPERATIVE OPEN HEART      Prescriptions Prior to Admission  Medication Sig Dispense Refill Last Dose  . acetaminophen (TYLENOL) 500 MG tablet Take 1 tablet by mouth every 6 (six) hours as needed.   PRN at PRN  . aspirin EC 325 MG tablet Take 325 tablets by mouth daily.    08/10/2016 at 0800  . furosemide (LASIX) 20 MG tablet Take 20 mg by mouth daily as needed for edema.   prn at prn  . glipiZIDE  (GLUCOTROL) 5 MG tablet Take 1 tablet by mouth 2 (two) times daily.   08/10/2016 at 0800  . HUMULIN 70/30 KWIKPEN (70-30) 100 UNIT/ML PEN Inject 20 Units into the skin 2 (two) times daily.    08/09/2016 at 2000  . metoprolol tartrate (LOPRESSOR) 25 MG tablet Take 12.5 mg by mouth 2 (two) times daily.    08/10/2016 at 0800  . PARoxetine (PAXIL) 20 MG tablet Take 20 mg by mouth daily.    08/10/2016 at 0800  . Simethicone (GAS RELIEF PO) Take 1 tablet by mouth daily as needed.    PRN at PRN  . traZODone (DESYREL) 50 MG tablet Take 25 mg by mouth daily.   08/09/2016 at [place] History  . Marital status: Widowed    Spouse name: N/A  . Number of children: N/A  . Years of education: N/A   Occupational History  . Not on file.   Social History Main Topics  . Smoking status: Never Smoker  . Smokeless tobacco: Never Used  . Alcohol use No  . Drug use: No  . Sexual activity: Not on file   Other Topics Concern  . Not on file   Social History Narrative  . No narrative on file    Family History  Problem Relation Age of Onset  . Pancreatic cancer Mother   . Heart attack Father   . Colon cancer Sister       Review of systems complete and found to be negative unless listed above  PHYSICAL EXAM  General: Well developed, well nourished, in no acute distress HEENT:  Normocephalic and atramatic Neck:  No JVD.  Lungs: Bibasilar crackles, no wheezing Heart: Irregularly irregular Abdomen: Bowel sounds are positive Msk:  Back normal, able to sit upright in bed Extremities: 1+ bilateral peripheral edema Neuro: Alert and oriented X 3. Psych:  Good affect, responds appropriately  Labs:   Lab Results  Component Value Date   WBC 4.1 08/11/2016   HGB 12.5 08/11/2016   HCT 39.0 08/11/2016   MCV 84.5 08/11/2016   PLT 165 08/11/2016    Recent Labs Lab 08/10/16 1432 08/11/16 0437  NA 136 141  K 5.1 4.0  CL 102 104  CO2 25 31  BUN 15 19  CREATININE 1.07*  1.26*  CALCIUM 8.6* 8.7*  PROT 7.0  --   BILITOT 0.9  --   ALKPHOS 87  --   ALT 13*  --   AST 30  --   GLUCOSE 170* 63*   Lab Results  Component Value Date   TROPONINI <0.03 08/10/2016   No results found for: CHOL No results found for: HDL No results found for: LDLCALC No results found for: TRIG No results found for: CHOLHDL No results found for: LDLDIRECT    Radiology: Dg Chest 2 View  Result Date: 08/10/2016 CLINICAL DATA:  83 year old female with a history of shortness of breath EXAM: CHEST  2 VIEW COMPARISON:  11/18/2014 FINDINGS: Cardiomediastinal silhouette unchanged in size. Fullness in the central vasculature. Mild interlobular septal thickening. Linear opacities at the bilateral lung bases, more pronounced on the left. Blunting of the left costophrenic angle. Meniscus on the lateral view involving bilateral costophrenic sulci. Surgical changes of median sternotomy and CABG. Calcifications of the aortic arch. No confluent airspace disease. IMPRESSION: Evidence of early pulmonary edema and small left greater than right pleural effusions. No evidence of lobar pneumonia. Aortic atherosclerosis with surgical changes of prior median sternotomy and CABG Electronically Signed   By: Corrie Mckusick D.O.   On: 08/10/2016 15:10    EKG: Atrial fibrillation, rate 65 bpm   ASSESSMENT AND PLAN:  1. Acute on chronic diastolic heart failure, clinically improving with IV lasix, EF >55% per echo 2010 2. CAD status post CABG 2010, negative troponin, no chest pain 3. Atrial fibrillation, rate controlled, currently on aspirin, not on chronic anticoagulation due to intolerance to Xarelto and Eliquis  Recommendations: 1. Agree with current therapy 2. Continue diuresis with careful monitoring of renal status 3. Review 2D echocardiogram 4. Consider Pradaxa or warfarin for stroke prevention 5. Further recommendations pending echo and patient's initial course  Signed: Clabe Seal, PA-C 08/11/2016,  8:56 AM

## 2016-08-11 NOTE — Progress Notes (Signed)
[facility] [place] NAME: Pamela Chaney    MR#:  [id number]  DATE OF BIRTH:  10/05/1925  SUBJECTIVE:  Came in with increasing shortness of breath and leg swelling with leg tightness Started on Lasix and feels a lot better No chest pain  Uses oxygen chronically at bedtime  REVIEW OF SYSTEMS:   Review of Systems  Constitutional: Negative for chills, fever and weight loss.  HENT: Negative for ear discharge, ear pain and nosebleeds.   Eyes: Negative for blurred vision, pain and discharge.  Respiratory: Positive for shortness of breath. Negative for sputum production, wheezing and stridor.   Cardiovascular: Positive for leg swelling. Negative for chest pain, palpitations, orthopnea and PND.  Gastrointestinal: Negative for abdominal pain, diarrhea, nausea and vomiting.  Genitourinary: Negative for frequency and urgency.  Musculoskeletal: Negative for back pain and joint pain.  Neurological: Positive for weakness. Negative for sensory change, speech change and focal weakness.  Psychiatric/Behavioral: Negative for depression and hallucinations. The patient is not nervous/anxious.    Tolerating Diet:yes Tolerating PT:  pending  DRUG ALLERGIES:   Allergies  Allergen Reactions  . Lisinopril Other (See Comments)    Causes weakness.  . Morphine And Related Other (See Comments)    Hallucinations    VITALS:  Blood pressure (!) 144/70, pulse 72, temperature 97.8 F (36.6 C), temperature source Oral, resp. rate 16, height 5\' 8"  (1.727 m), weight 92.3 kg (203 lb 6.4 oz), SpO2 97 %.  PHYSICAL EXAMINATION:   Physical Exam  GENERAL:  83 y.o.-year-old patient lying in the bed with no acute distress.  EYES: Pupils equal, round, reactive to light and accommodation. No scleral icterus. Extraocular muscles intact.  HEENT: Head atraumatic, normocephalic. Oropharynx and nasopharynx clear.  NECK:  Supple, no jugular venous distention. No  thyroid enlargement, no tenderness.  LUNGS: decreased breath sounds bilaterally, no wheezing,++ rales,no rhonchi. No use of accessory muscles of respiration.  CARDIOVASCULAR: S1, S2 normal. No murmurs, rubs, or gallops.  ABDOMEN: Soft, nontender, nondistended. Bowel sounds present. No organomegaly or mass.  EXTREMITIES: No cyanosis, clubbing  +++ bilateral edema b/l.    NEUROLOGIC: Cranial nerves II through XII are intact. No focal Motor or sensory deficits b/l.   PSYCHIATRIC:  patient is alert and oriented x 3.  SKIN: No obvious rash, lesion, or ulcer.   LABORATORY PANEL:  CBC  Recent Labs Lab 08/11/16 0437  WBC 4.1  HGB 12.5  HCT 39.0  PLT 165    Chemistries   Recent Labs Lab 08/10/16 1432 08/11/16 0437  NA 136 141  K 5.1 4.0  CL 102 104  CO2 25 31  GLUCOSE 170* 63*  BUN 15 19  CREATININE 1.07* 1.26*  CALCIUM 8.6* 8.7*  AST 30  --   ALT 13*  --   ALKPHOS 87  --   BILITOT 0.9  --    Cardiac Enzymes  Recent Labs Lab 08/10/16 1432  TROPONINI <0.03   RADIOLOGY:  Dg Chest 2 View  Result Date: 08/10/2016 CLINICAL DATA:  83 year old female with a history of shortness of breath EXAM: CHEST  2 VIEW COMPARISON:  11/18/2014 FINDINGS: Cardiomediastinal silhouette unchanged in size. Fullness in the central vasculature. Mild interlobular septal thickening. Linear opacities at the bilateral lung bases, more pronounced on the left. Blunting of the left costophrenic angle. Meniscus on the lateral view involving bilateral costophrenic sulci. Surgical changes of median sternotomy and CABG. Calcifications of the aortic arch. No confluent airspace disease. IMPRESSION:  Evidence of early pulmonary edema and small left greater than right pleural effusions. No evidence of lobar pneumonia. Aortic atherosclerosis with surgical changes of prior median sternotomy and CABG Electronically Signed   By: Corrie Mckusick D.O.   On: 08/10/2016 15:10   ASSESSMENT AND PLAN:  83 year old female with  past medical history of chronic atrial fibrillation, pulmonary hypertension, essential hypertension, history of colon cancer, femoral neuropathy, diabetes, coronary artery disease who presents to the hospital due to shortness of breath and worsening lower extremity edema.  1. CHF-acute on chronic diastolic dysfunction. This is the cause of patient's worsening shortness of breath and lower extremity edema. - diurese with IV Lasix, follow I's and O's and daily weights. -weight down 229---203 lbs -UOP >3 liters -Continue metoprolol  - echocardiogram - cardiology consult with Dr Josefa Half -assess for oxygen use during daytime. Patient uses chronically oxygen at nighttime.  2. Diabetes type 2 without complication --changed o Levemir - Continue glipizide, will place on sliding scale insulin, carb-controlled diet. Follow blood sugars.  3. Essential hypertension-continue metoprolol.  4. Anxiety-continue Paxil.  5. History of chronic atrial fibrillation-rate controlled, continue metoprolol. -Continue aspirin. Patient is not on long-term anticoagulation as she has been intolerant to both Xarelto, Eliquis due to Wheezing, shortness of breath.   PT to see pt  CM for d/c planning  Case discussed with Care Management/Social Worker. Management plans discussed with the patient, family and they are in agreement.  CODE STATUS: FULL DVT Prophylaxis: lovenox  TOTAL TIME TAKING CARE OF THIS PATIENT: *30* minutes.  >50% time spent on counselling and coordination of care  POSSIBLE D/C IN 1-2* DAYS, DEPENDING ON CLINICAL CONDITION.  Note: This dictation was prepared with Dragon dictation along with smaller phrase technology. Any transcriptional errors that result from this process are unintentional.  Laquia Rosano M.D on 08/11/2016 at 11:09 AM  Between 7am to 6pm - Pager - [phone]  After 6pm go to [email or web address] - password [email or web address]  Office   [phone]  CC: Primary care physician; Idelle Crouch, MD

## 2016-08-11 NOTE — Care Management (Addendum)
AdmiLives alone and Independent in all adls, denies issues accessing medical care, obtaining medications or with transportation.  Current with her PCP.  Chronic home oxygen through [place].  Has access to a walker and cane.  Obtained order for [facility] and faxed it to [place] per request.  Asked agency to contact patient or daughter to discuss when and where to deliver.  Patient prefers not to to have it delivered to the hospital.  Provided Education Booklet and discussed referral to [facility].  Agreeable to have home health nurse follow.  Agency preference is Nurse, learning disability.  Referral called and accepted. Physical therapy informed CM that patient told him she only wore her 02 at night. Contacted  [facility] and confirmed that patient's order is for continuous.  Also confirmed that the fax has been received for the [facility].

## 2016-08-11 NOTE — Evaluation (Signed)
Physical Therapy Evaluation Patient Details Name: Pamela Chaney MRN: [id number] DOB: [birth year] Today's Date: 08/11/2016   History of Present Illness  83 y.o. female with a known history of Atrial fibrillation, colon cancer, diabetes, hypertension, pulmonary hypertension, peripheral neuropathy, coronary artery disease who presents to the hospital due to shortness of breath and worsening lower extremity edema. Patient says that she has noticed worsening lower extremity edema now for the past 3-4 weeks. She is on oxygen at home at bedtime but for the past few days she's had to use it more frequently even at exertion because of worsening shortness of breath  Clinical Impression  Pt did well with PT exam and showed good mobility and ability to ambulate in-home distances as well as negotiate up/down steps.  We did try to do some activities without O2 and her sats dropped into the mid 80s, sats stayed low/mid 90s on 2 liters t/o the session.  She showed great motivation to go home and does have supportive family that checks on her essentially daily. Pt near her baseline, but had some fatigue and increased O2 demand, she would benefit from HHPT to get back to her baseline status.     Follow Up Recommendations Home health PT    Equipment Recommendations       Recommendations for Other Services       Precautions / Restrictions Precautions Precautions: Fall Restrictions Weight Bearing Restrictions: No      Mobility  Bed Mobility Overal bed mobility: Independent             General bed mobility comments: Pt able to get to EOB w/o assist, showed good confidence   Transfers Overall transfer level: Independent Equipment used: Rolling walker (2 wheeled)             General transfer comment: Pt was able to rise to standing w/o assist, showed good safety and confidence  Ambulation/Gait Ambulation/Gait assistance: Supervision Ambulation Distance (Feet): 80 Feet Assistive device:  Rolling walker (2 wheeled)       General Gait Details: Pt was able to ambulate with slow but consistent and safe cadence.  She did fatigue walking back to the room and needed a brief rest break but ultimately was able to walk near her baseline (speed and distance).  Pt on 2 liters O2 t/o the effort and sats remain in the 90s.    Stairs Stairs: Yes Stairs assistance: Supervision Stair Management: Two rails Number of Stairs: 6 General stair comments: Pt was able to negotiate up/down steps well, deliberate steps, but safe  Wheelchair Mobility    Modified Rankin (Stroke Patients Only)       Balance Overall balance assessment: Modified Independent                                           Pertinent Vitals/Pain Pain Assessment: No/denies pain (reports very mild chest pain post ambulation)    Home Living Family/patient expects to be discharged to:: Private residence Living Arrangements: Alone Available Help at Discharge: Family (a family member checks on her almost daily) Type of Home: House Home Access: Stairs to enter Entrance Stairs-Rails: Can reach both Entrance Stairs-Number of Steps: 3   Home Equipment: Walker - 4 wheels;Cane - single point      Prior Function Level of Independence: Independent with assistive device(s)         Comments: Pt reports  she mostly uses the cane in the home, rollator when she needs it and when out of the house.  She is really only out for MD appointments     Hand Dominance        Extremity/Trunk Assessment   Upper Extremity Assessment Upper Extremity Assessment: Overall WFL for tasks assessed (age appropriate limitations, L UE grossly 3+/5, R 4/5)    Lower Extremity Assessment Lower Extremity Assessment: Overall WFL for tasks assessed       Communication   Communication: No difficulties  Cognition Arousal/Alertness: Awake/alert Behavior During Therapy: WFL for tasks assessed/performed Overall Cognitive  Status: Within Functional Limits for tasks assessed                                        General Comments      Exercises     Assessment/Plan    PT Assessment Patient needs continued PT services  PT Problem List Decreased strength;Decreased range of motion;Decreased activity tolerance;Decreased balance;Decreased mobility;Decreased safety awareness;Cardiopulmonary status limiting activity       PT Treatment Interventions DME instruction;Gait training;Stair training;Functional mobility training;Therapeutic activities;Therapeutic exercise;Balance training;Patient/family education    PT Goals (Current goals can be found in the Care Plan section)  Acute Rehab PT Goals Patient Stated Goal: go home PT Goal Formulation: With patient Time For Goal Achievement: 08/25/16 Potential to Achieve Goals: Good    Frequency Min 2X/week   Barriers to discharge        Co-evaluation               End of Session Equipment Utilized During Treatment: Gait belt;Oxygen (seated activity w/ room air, sats dropped to mid 80s) Activity Tolerance: Patient limited by fatigue;Patient tolerated treatment well Patient left: with chair alarm set;with call bell/phone within reach Nurse Communication: Mobility status PT Visit Diagnosis: Muscle weakness (generalized) (M62.81);Difficulty in walking, not elsewhere classified (R26.2)    Time: 4163-8453 PT Time Calculation (min) (ACUTE ONLY): 28 min   Charges:   PT Evaluation $PT Eval Low Complexity: 1 Procedure     PT G Codes:        Kreg Shropshire, DPT 08/11/2016, 4:13 PM

## 2016-08-12 LAB — GLUCOSE, CAPILLARY: Glucose-Capillary: 93 mg/dL (ref 65–99)

## 2016-08-12 LAB — ECHOCARDIOGRAM COMPLETE
Height: 68 in
WEIGHTICAEL: 3254.4 [oz_av]

## 2016-08-12 MED ORDER — FUROSEMIDE 40 MG PO TABS
40.0000 mg | ORAL_TABLET | Freq: Every day | ORAL | Status: DC
  Administered 2016-08-12: 40 mg via ORAL
  Filled 2016-08-12: qty 1

## 2016-08-12 MED ORDER — FUROSEMIDE 40 MG PO TABS: 40.0000 mg | ORAL_TABLET | Freq: Every day | ORAL | 0 refills | Status: AC

## 2016-08-12 NOTE — Discharge Summary (Signed)
[facility] [place] NAME: Pamela Chaney    MR#:  [id number]  DATE OF BIRTH:  1925/08/12  DATE OF ADMISSION:  08/10/2016 ADMITTING PHYSICIAN: Henreitta Leber, MD  DATE OF DISCHARGE: 08/12/16  PRIMARY CARE PHYSICIAN: SPARKS,JEFFREY D, MD    ADMISSION DIAGNOSIS:  Acute on chronic congestive heart failure, unspecified heart failure type ([place]) [I50.9]  DISCHARGE DIAGNOSIS:  CHF-acute on chronic diastolic dysfunction.  SECONDARY DIAGNOSIS:   Past Medical History:  Diagnosis Date  . Atrial fibrillation ([place])   . Cancer ([place])    colon  . Coronary artery disease   . Diabetes mellitus without complication ([place])   . High cholesterol   . Hypertension   . Peripheral neuropathy   . Pulmonary hypertension [facility])     HOSPITAL COURSE:   83 year old female with past medical history of chronic atrial fibrillation, pulmonary hypertension, essential hypertension, history of colon cancer, femoral neuropathy, diabetes, coronary artery disease who presents to the hospital due to shortness of breath and worsening lower extremity edema.  1. CHF-acute on chronic diastolic dysfunction. This is the cause of patient's worsening shortness of breath and lower extremity edema. - diurese with IV Lasix, follow I's and O's and daily weights. Pt will d/c on po lasix 40 mg for now. Defer to PCP to change dose depending in pt's symptoms on f/u -weight down 229---202 lbs -UOP >6 liters -Continue metoprolol  - cardiology consult with Dr Josefa Half appreciated -pt recommended to use during daytime. Patient uses chronically oxygen at nighttime.  2. Diabetes type 2 without complication --changed o Levemir here. She is on NPH at home. Defer to PCP if t can be changed to levemir as outpt - Continue glipizide, will place on sliding scale insulin, carb-controlled diet.  3. Essential hypertension-continue metoprolol.  4. Anxiety-continue Paxil.  5. History of  chronic atrial fibrillation-rate controlled, continue metoprolol. -Continue aspirin. Patient is not on long-term anticoagulation as she has been intolerant to both Xarelto, Eliquis due to Wheezing, shortness of breath.   PT to see pt--recommends HHPT  CM for d/c planning. HHRN  D/c home. Overall much improved  Case discussed with Care Management/Social Worker. Management plans discussed with the patient, family and they are in agreement.  CONSULTS OBTAINED:  Treatment Team:  Isaias Cowman, MD  DRUG ALLERGIES:   Allergies  Allergen Reactions  . Lisinopril Other (See Comments)    Causes weakness.  . Morphine And Related Other (See Comments)    Hallucinations    DISCHARGE MEDICATIONS:   Current Discharge Medication List    CONTINUE these medications which have CHANGED   Details  furosemide (LASIX) 40 MG tablet Take 1 tablet (40 mg total) by mouth daily. Qty: 30 tablet, Refills: 0      CONTINUE these medications which have NOT CHANGED   Details  acetaminophen (TYLENOL) 500 MG tablet Take 1 tablet by mouth every 6 (six) hours as needed.    aspirin EC 325 MG tablet Take 325 tablets by mouth daily.     glipiZIDE (GLUCOTROL) 5 MG tablet Take 1 tablet by mouth 2 (two) times daily.    HUMULIN 70/30 KWIKPEN (70-30) 100 UNIT/ML PEN Inject 20 Units into the skin 2 (two) times daily.     metoprolol tartrate (LOPRESSOR) 25 MG tablet Take 12.5 mg by mouth 2 (two) times daily.     PARoxetine (PAXIL) 20 MG tablet Take 20 mg by mouth daily.     Simethicone (GAS RELIEF PO) Take  1 tablet by mouth daily as needed.     traZODone (DESYREL) 50 MG tablet Take 25 mg by mouth daily.        If you experience worsening of your admission symptoms, develop shortness of breath, life threatening emergency, suicidal or homicidal thoughts you must seek medical attention immediately by calling 911 or calling your MD immediately  if symptoms less severe.  You Must read complete  instructions/literature along with all the possible adverse reactions/side effects for all the Medicines you take and that have been prescribed to you. Take any new Medicines after you have completely understood and accept all the possible adverse reactions/side effects.   Please note  You were cared for by a hospitalist during your hospital stay. If you have any questions about your discharge medications or the care you received while you were in the hospital after you are discharged, you can call the unit and asked to speak with the hospitalist on call if the hospitalist that took care of you is not available. Once you are discharged, your primary care physician will handle any further medical issues. Please note that NO REFILLS for any discharge medications will be authorized once you are discharged, as it is imperative that you return to your primary care physician (or establish a relationship with a primary care physician if you do not have one) for your aftercare needs so that they can reassess your need for medications and monitor your lab values. Today   SUBJECTIVE   No new complaints. Slept good  VITAL SIGNS:  Blood pressure 137/64, pulse 73, temperature 97.8 F (36.6 C), temperature source Oral, resp. rate 18, height 5\' 8"  (1.727 m), weight 91.8 kg (202 lb 6.4 oz), SpO2 93 %.  I/O:   Intake/Output Summary (Last 24 hours) at 08/12/16 0738 Last data filed at 08/12/16 0655  Gross per 24 hour  Intake              480 ml  Output             1950 ml  Net            -1470 ml    PHYSICAL EXAMINATION:  GENERAL:  83 y.o.-year-old patient lying in the bed with no acute distress. obese EYES: Pupils equal, round, reactive to light and accommodation. No scleral icterus. Extraocular muscles intact.  HEENT: Head atraumatic, normocephalic. Oropharynx and nasopharynx clear.  NECK:  Supple, no jugular venous distention. No thyroid enlargement, no tenderness.  LUNGS: Normal breath sounds  bilaterally, no wheezing, rales,rhonchi or crepitation. No use of accessory muscles of respiration.  CARDIOVASCULAR: S1, S2 normal. No murmurs, rubs, or gallops.  ABDOMEN: Soft, non-tender, non-distended. Bowel sounds present. No organomegaly or mass.  EXTREMITIES: + pedal edema, no cyanosis, or clubbing.  NEUROLOGIC: Cranial nerves II through XII are intact. Muscle strength 5/5 in all extremities. Sensation intact. Gait not checked.  PSYCHIATRIC: The patient is alert and oriented x 3.  SKIN: No obvious rash, lesion, or ulcer.   DATA REVIEW:   CBC   Recent Labs Lab 08/11/16 0437  WBC 4.1  HGB 12.5  HCT 39.0  PLT 165    Chemistries   Recent Labs Lab 08/10/16 1432 08/11/16 0437  NA 136 141  K 5.1 4.0  CL 102 104  CO2 25 31  GLUCOSE 170* 63*  BUN 15 19  CREATININE 1.07* 1.26*  CALCIUM 8.6* 8.7*  AST 30  --   ALT 13*  --   ALKPHOS 87  --  BILITOT 0.9  --     Microbiology Results   No results found for this or any previous visit (from the past 240 hour(s)).  RADIOLOGY:  Dg Chest 2 View  Result Date: 08/10/2016 CLINICAL DATA:  83 year old female with a history of shortness of breath EXAM: CHEST  2 VIEW COMPARISON:  11/18/2014 FINDINGS: Cardiomediastinal silhouette unchanged in size. Fullness in the central vasculature. Mild interlobular septal thickening. Linear opacities at the bilateral lung bases, more pronounced on the left. Blunting of the left costophrenic angle. Meniscus on the lateral view involving bilateral costophrenic sulci. Surgical changes of median sternotomy and CABG. Calcifications of the aortic arch. No confluent airspace disease. IMPRESSION: Evidence of early pulmonary edema and small left greater than right pleural effusions. No evidence of lobar pneumonia. Aortic atherosclerosis with surgical changes of prior median sternotomy and CABG Electronically Signed   By: Corrie Mckusick D.O.   On: 08/10/2016 15:10     Management plans discussed with the  patient, family and they are in agreement.  CODE STATUS:     Code Status Orders        Start     Ordered   08/10/16 1738  Full code  Continuous     08/10/16 1737    Code Status History    Date Active Date Inactive Code Status Order ID Comments User Context   This patient has a current code status but no historical code status.      TOTAL TIME TAKING CARE OF THIS PATIENT: *40* minutes.    Naileah Karg M.D on 08/12/2016 at 7:38 AM  Between 7am to 6pm - Pager - [phone] After 6pm go to [email or web address] - password [facility]  Office  [phone]  CC: Primary care physician; Idelle Crouch, MD

## 2016-08-12 NOTE — Progress Notes (Signed)
Pt discharged to home via wc.  Instructions  given to pt.  Questions answered.  No distress.  

## 2016-08-12 NOTE — Care Management Note (Signed)
Case Management Note  Patient Details  Name: Pamela Chaney MRN: [id number] Date of Birth: June 22, 1925  Subjective/Objective:      A referral for HH-PT and RN was called to [facility] as per patient choice.               Action/Plan:   Expected Discharge Date:  08/12/16               Expected Discharge Plan:     In-House Referral:     Discharge planning Services  [facility]  Post Acute Care Choice:    Choice offered to:     DME Arranged:  Bedside commode DME Agency:  [facility]  HH Arranged:  RN, PT [facility] Agency:  [place]  Status of Service:  Completed, signed off  If discussed at [place] of Stay Meetings, dates discussed:    Additional Comments:  Shermaine Rivet A, RN 08/12/2016, 9:46 AM

## 2016-08-14 DIAGNOSIS — I482 Chronic atrial fibrillation: Secondary | ICD-10-CM | POA: Diagnosis not present

## 2016-08-14 DIAGNOSIS — I2510 Atherosclerotic heart disease of native coronary artery without angina pectoris: Secondary | ICD-10-CM | POA: Diagnosis not present

## 2016-08-14 DIAGNOSIS — I5033 Acute on chronic diastolic (congestive) heart failure: Secondary | ICD-10-CM | POA: Diagnosis not present

## 2016-08-14 DIAGNOSIS — I110 Hypertensive heart disease with heart failure: Secondary | ICD-10-CM | POA: Diagnosis not present

## 2016-08-14 DIAGNOSIS — E1142 Type 2 diabetes mellitus with diabetic polyneuropathy: Secondary | ICD-10-CM | POA: Diagnosis not present

## 2016-08-14 DIAGNOSIS — I272 Pulmonary hypertension, unspecified: Secondary | ICD-10-CM | POA: Diagnosis not present

## 2016-08-16 ENCOUNTER — Telehealth: Payer: Self-pay

## 2016-08-16 DIAGNOSIS — I2510 Atherosclerotic heart disease of native coronary artery without angina pectoris: Secondary | ICD-10-CM | POA: Diagnosis not present

## 2016-08-16 DIAGNOSIS — I482 Chronic atrial fibrillation: Secondary | ICD-10-CM | POA: Diagnosis not present

## 2016-08-16 DIAGNOSIS — I10 Essential (primary) hypertension: Secondary | ICD-10-CM | POA: Diagnosis not present

## 2016-08-16 DIAGNOSIS — E782 Mixed hyperlipidemia: Secondary | ICD-10-CM | POA: Diagnosis not present

## 2016-08-16 DIAGNOSIS — I272 Pulmonary hypertension, unspecified: Secondary | ICD-10-CM | POA: Diagnosis not present

## 2016-08-16 DIAGNOSIS — I5032 Chronic diastolic (congestive) heart failure: Secondary | ICD-10-CM | POA: Diagnosis not present

## 2016-08-16 NOTE — Telephone Encounter (Signed)
-----   Message from Alisa Graff, [place] sent at 08/15/2016  9:18 AM EDT ----- Regarding: Please call to schedule appointment Contact: [phone] Referred by Nann; been discharged

## 2016-08-16 NOTE — Telephone Encounter (Signed)
No answer, will attempt to contact again.

## 2016-08-17 ENCOUNTER — Other Ambulatory Visit: Payer: Self-pay | Admitting: *Deleted

## 2016-08-17 DIAGNOSIS — I5033 Acute on chronic diastolic (congestive) heart failure: Secondary | ICD-10-CM | POA: Diagnosis not present

## 2016-08-17 DIAGNOSIS — E1121 Type 2 diabetes mellitus with diabetic nephropathy: Secondary | ICD-10-CM | POA: Diagnosis not present

## 2016-08-17 DIAGNOSIS — I482 Chronic atrial fibrillation: Secondary | ICD-10-CM | POA: Diagnosis not present

## 2016-08-17 DIAGNOSIS — E1142 Type 2 diabetes mellitus with diabetic polyneuropathy: Secondary | ICD-10-CM | POA: Diagnosis not present

## 2016-08-17 DIAGNOSIS — I272 Pulmonary hypertension, unspecified: Secondary | ICD-10-CM | POA: Diagnosis not present

## 2016-08-17 DIAGNOSIS — I5032 Chronic diastolic (congestive) heart failure: Secondary | ICD-10-CM | POA: Diagnosis not present

## 2016-08-17 DIAGNOSIS — I2510 Atherosclerotic heart disease of native coronary artery without angina pectoris: Secondary | ICD-10-CM | POA: Diagnosis not present

## 2016-08-17 DIAGNOSIS — E782 Mixed hyperlipidemia: Secondary | ICD-10-CM | POA: Diagnosis not present

## 2016-08-17 DIAGNOSIS — I110 Hypertensive heart disease with heart failure: Secondary | ICD-10-CM | POA: Diagnosis not present

## 2016-08-17 DIAGNOSIS — I509 Heart failure, unspecified: Secondary | ICD-10-CM | POA: Diagnosis not present

## 2016-08-17 DIAGNOSIS — Z79899 Other long term (current) drug therapy: Secondary | ICD-10-CM | POA: Diagnosis not present

## 2016-08-17 NOTE — Patient Outreach (Signed)
[facility]) Care Management  08/17/2016  Pamela Chaney [birth year] [id number]   Transition of Care Referral  Referral Date: 08/15/16 Referral Source: [facility] Date of Discharge: 08/12/16 Facility: [facility] Discharge Diagnosis: CHF Insurance: [place] attempt # 1 spoke with patient regarding discharge from [facility]. HIPAA verified with patient.   Social: Patient lives alone and she is independent with ADLs. Patient verbalized being capable of driving. Per patient, her daughter is transporting her to all medical appointments Patient reported wanting her health to improve, before she started back driving. Patient voiced having a rolling walker, shower chair, BSC, and oxygen ([employer]).  Conditions: Past Medical Hx: A-Fib, Colon Cancer, CAD, DM, HLD, HTN, Peripheral Neuropathy, Pulmonary HTN Patient was recently admitted at [facility] from 08/10/16 to 08/12/16 for CHF. She verbalized receiving and understanding her discharge instructions. Patient reported weighing herself daily, first thing in the morning, after voiding. Her weight has been around 218 to 219. Patient verbalized having home health services with [facility]. Her next home health visit is scheduled for 08/21/16.   Medications. Patient reported taking 8 meds per day. Patient reported being able to afford her medications and taking them as prescribed. Patient had no questions or concerns about her meds.   Appointments:  Patient verbalized having a PCP office visit scheduled for 08/17/16 at 11:00 am. Patient reported having an office visit with her Cardiologist on 08/16/16.  Advanced Directive: Patient reported HPOA paperwork is incomplete. She verbalized, she is "working on getting her paperwork finalized".   Consent:   [facility] services reviewed and discussed with patient. Patient assessed and no further interventions needed.  Plan: RN CM advised patient  to contact RN CM for any needs or concerns. RN CM advised patient to alert MD for any changes in conditions.  RN CM provided patient with [facility] 24hr Nurse Line contact info. RN CM will notify [facility] CM administrative assistant regarding case closure.  [name], RN, BSN, MHA/MSL, [place] Telephonic Care Manager Coordinator [facility] Direct Phone: [phone] Toll Free: [phone] Fax: [phone]

## 2016-08-18 DIAGNOSIS — I272 Pulmonary hypertension, unspecified: Secondary | ICD-10-CM | POA: Diagnosis not present

## 2016-08-18 DIAGNOSIS — I2541 Coronary artery aneurysm: Secondary | ICD-10-CM | POA: Diagnosis not present

## 2016-08-18 DIAGNOSIS — E1142 Type 2 diabetes mellitus with diabetic polyneuropathy: Secondary | ICD-10-CM | POA: Diagnosis not present

## 2016-08-18 DIAGNOSIS — I2510 Atherosclerotic heart disease of native coronary artery without angina pectoris: Secondary | ICD-10-CM | POA: Diagnosis not present

## 2016-08-18 DIAGNOSIS — D649 Anemia, unspecified: Secondary | ICD-10-CM | POA: Diagnosis not present

## 2016-08-18 DIAGNOSIS — I482 Chronic atrial fibrillation: Secondary | ICD-10-CM | POA: Diagnosis not present

## 2016-08-18 DIAGNOSIS — R0902 Hypoxemia: Secondary | ICD-10-CM | POA: Diagnosis not present

## 2016-08-18 DIAGNOSIS — I5033 Acute on chronic diastolic (congestive) heart failure: Secondary | ICD-10-CM | POA: Diagnosis not present

## 2016-08-18 DIAGNOSIS — I110 Hypertensive heart disease with heart failure: Secondary | ICD-10-CM | POA: Diagnosis not present

## 2016-08-21 NOTE — Telephone Encounter (Signed)
I spoke with Pamela Chaney about establishing care. She states that right now she is having PT and she just doesn't have the time. She would like [place] to call her back in a couple weeks when things settle down.

## 2016-08-22 DIAGNOSIS — I110 Hypertensive heart disease with heart failure: Secondary | ICD-10-CM | POA: Diagnosis not present

## 2016-08-22 DIAGNOSIS — I2510 Atherosclerotic heart disease of native coronary artery without angina pectoris: Secondary | ICD-10-CM | POA: Diagnosis not present

## 2016-08-22 DIAGNOSIS — I5033 Acute on chronic diastolic (congestive) heart failure: Secondary | ICD-10-CM | POA: Diagnosis not present

## 2016-08-22 DIAGNOSIS — I482 Chronic atrial fibrillation: Secondary | ICD-10-CM | POA: Diagnosis not present

## 2016-08-22 DIAGNOSIS — E1142 Type 2 diabetes mellitus with diabetic polyneuropathy: Secondary | ICD-10-CM | POA: Diagnosis not present

## 2016-08-22 DIAGNOSIS — I272 Pulmonary hypertension, unspecified: Secondary | ICD-10-CM | POA: Diagnosis not present

## 2016-08-23 DIAGNOSIS — I272 Pulmonary hypertension, unspecified: Secondary | ICD-10-CM | POA: Diagnosis not present

## 2016-08-23 DIAGNOSIS — I5033 Acute on chronic diastolic (congestive) heart failure: Secondary | ICD-10-CM | POA: Diagnosis not present

## 2016-08-23 DIAGNOSIS — I2510 Atherosclerotic heart disease of native coronary artery without angina pectoris: Secondary | ICD-10-CM | POA: Diagnosis not present

## 2016-08-23 DIAGNOSIS — I482 Chronic atrial fibrillation: Secondary | ICD-10-CM | POA: Diagnosis not present

## 2016-08-23 DIAGNOSIS — I110 Hypertensive heart disease with heart failure: Secondary | ICD-10-CM | POA: Diagnosis not present

## 2016-08-23 DIAGNOSIS — E1142 Type 2 diabetes mellitus with diabetic polyneuropathy: Secondary | ICD-10-CM | POA: Diagnosis not present

## 2016-08-25 DIAGNOSIS — I2510 Atherosclerotic heart disease of native coronary artery without angina pectoris: Secondary | ICD-10-CM | POA: Diagnosis not present

## 2016-08-25 DIAGNOSIS — I272 Pulmonary hypertension, unspecified: Secondary | ICD-10-CM | POA: Diagnosis not present

## 2016-08-25 DIAGNOSIS — E1142 Type 2 diabetes mellitus with diabetic polyneuropathy: Secondary | ICD-10-CM | POA: Diagnosis not present

## 2016-08-25 DIAGNOSIS — I110 Hypertensive heart disease with heart failure: Secondary | ICD-10-CM | POA: Diagnosis not present

## 2016-08-25 DIAGNOSIS — I482 Chronic atrial fibrillation: Secondary | ICD-10-CM | POA: Diagnosis not present

## 2016-08-25 DIAGNOSIS — I5033 Acute on chronic diastolic (congestive) heart failure: Secondary | ICD-10-CM | POA: Diagnosis not present

## 2016-08-28 DIAGNOSIS — I2510 Atherosclerotic heart disease of native coronary artery without angina pectoris: Secondary | ICD-10-CM | POA: Diagnosis not present

## 2016-08-28 DIAGNOSIS — I482 Chronic atrial fibrillation: Secondary | ICD-10-CM | POA: Diagnosis not present

## 2016-08-28 DIAGNOSIS — I110 Hypertensive heart disease with heart failure: Secondary | ICD-10-CM | POA: Diagnosis not present

## 2016-08-28 DIAGNOSIS — I272 Pulmonary hypertension, unspecified: Secondary | ICD-10-CM | POA: Diagnosis not present

## 2016-08-28 DIAGNOSIS — R0602 Shortness of breath: Secondary | ICD-10-CM | POA: Diagnosis not present

## 2016-08-28 DIAGNOSIS — I5033 Acute on chronic diastolic (congestive) heart failure: Secondary | ICD-10-CM | POA: Diagnosis not present

## 2016-08-28 DIAGNOSIS — E1142 Type 2 diabetes mellitus with diabetic polyneuropathy: Secondary | ICD-10-CM | POA: Diagnosis not present

## 2016-08-29 DIAGNOSIS — I272 Pulmonary hypertension, unspecified: Secondary | ICD-10-CM | POA: Diagnosis not present

## 2016-08-29 DIAGNOSIS — I5033 Acute on chronic diastolic (congestive) heart failure: Secondary | ICD-10-CM | POA: Diagnosis not present

## 2016-08-29 DIAGNOSIS — I2510 Atherosclerotic heart disease of native coronary artery without angina pectoris: Secondary | ICD-10-CM | POA: Diagnosis not present

## 2016-08-29 DIAGNOSIS — I482 Chronic atrial fibrillation: Secondary | ICD-10-CM | POA: Diagnosis not present

## 2016-08-29 DIAGNOSIS — I110 Hypertensive heart disease with heart failure: Secondary | ICD-10-CM | POA: Diagnosis not present

## 2016-08-29 DIAGNOSIS — E1142 Type 2 diabetes mellitus with diabetic polyneuropathy: Secondary | ICD-10-CM | POA: Diagnosis not present

## 2016-08-30 DIAGNOSIS — I5033 Acute on chronic diastolic (congestive) heart failure: Secondary | ICD-10-CM | POA: Diagnosis not present

## 2016-08-30 DIAGNOSIS — I2510 Atherosclerotic heart disease of native coronary artery without angina pectoris: Secondary | ICD-10-CM | POA: Diagnosis not present

## 2016-08-30 DIAGNOSIS — E1142 Type 2 diabetes mellitus with diabetic polyneuropathy: Secondary | ICD-10-CM | POA: Diagnosis not present

## 2016-08-30 DIAGNOSIS — I272 Pulmonary hypertension, unspecified: Secondary | ICD-10-CM | POA: Diagnosis not present

## 2016-08-30 DIAGNOSIS — I110 Hypertensive heart disease with heart failure: Secondary | ICD-10-CM | POA: Diagnosis not present

## 2016-08-31 DIAGNOSIS — I272 Pulmonary hypertension, unspecified: Secondary | ICD-10-CM | POA: Diagnosis not present

## 2016-08-31 DIAGNOSIS — I482 Chronic atrial fibrillation: Secondary | ICD-10-CM | POA: Diagnosis not present

## 2016-08-31 DIAGNOSIS — I2510 Atherosclerotic heart disease of native coronary artery without angina pectoris: Secondary | ICD-10-CM | POA: Diagnosis not present

## 2016-08-31 DIAGNOSIS — I110 Hypertensive heart disease with heart failure: Secondary | ICD-10-CM | POA: Diagnosis not present

## 2016-08-31 DIAGNOSIS — E1142 Type 2 diabetes mellitus with diabetic polyneuropathy: Secondary | ICD-10-CM | POA: Diagnosis not present

## 2016-08-31 DIAGNOSIS — I5033 Acute on chronic diastolic (congestive) heart failure: Secondary | ICD-10-CM | POA: Diagnosis not present

## 2016-09-04 DIAGNOSIS — I5033 Acute on chronic diastolic (congestive) heart failure: Secondary | ICD-10-CM | POA: Diagnosis not present

## 2016-09-04 DIAGNOSIS — I2510 Atherosclerotic heart disease of native coronary artery without angina pectoris: Secondary | ICD-10-CM | POA: Diagnosis not present

## 2016-09-04 DIAGNOSIS — I110 Hypertensive heart disease with heart failure: Secondary | ICD-10-CM | POA: Diagnosis not present

## 2016-09-04 DIAGNOSIS — I272 Pulmonary hypertension, unspecified: Secondary | ICD-10-CM | POA: Diagnosis not present

## 2016-09-04 DIAGNOSIS — E1142 Type 2 diabetes mellitus with diabetic polyneuropathy: Secondary | ICD-10-CM | POA: Diagnosis not present

## 2016-09-04 DIAGNOSIS — I482 Chronic atrial fibrillation: Secondary | ICD-10-CM | POA: Diagnosis not present

## 2016-09-07 DIAGNOSIS — I482 Chronic atrial fibrillation: Secondary | ICD-10-CM | POA: Diagnosis not present

## 2016-09-07 DIAGNOSIS — I2510 Atherosclerotic heart disease of native coronary artery without angina pectoris: Secondary | ICD-10-CM | POA: Diagnosis not present

## 2016-09-07 DIAGNOSIS — I110 Hypertensive heart disease with heart failure: Secondary | ICD-10-CM | POA: Diagnosis not present

## 2016-09-07 DIAGNOSIS — I5033 Acute on chronic diastolic (congestive) heart failure: Secondary | ICD-10-CM | POA: Diagnosis not present

## 2016-09-07 DIAGNOSIS — E1142 Type 2 diabetes mellitus with diabetic polyneuropathy: Secondary | ICD-10-CM | POA: Diagnosis not present

## 2016-09-07 DIAGNOSIS — I272 Pulmonary hypertension, unspecified: Secondary | ICD-10-CM | POA: Diagnosis not present

## 2016-09-12 DIAGNOSIS — I2510 Atherosclerotic heart disease of native coronary artery without angina pectoris: Secondary | ICD-10-CM | POA: Diagnosis not present

## 2016-09-12 DIAGNOSIS — I5033 Acute on chronic diastolic (congestive) heart failure: Secondary | ICD-10-CM | POA: Diagnosis not present

## 2016-09-12 DIAGNOSIS — E1142 Type 2 diabetes mellitus with diabetic polyneuropathy: Secondary | ICD-10-CM | POA: Diagnosis not present

## 2016-09-12 DIAGNOSIS — I482 Chronic atrial fibrillation: Secondary | ICD-10-CM | POA: Diagnosis not present

## 2016-09-12 DIAGNOSIS — I110 Hypertensive heart disease with heart failure: Secondary | ICD-10-CM | POA: Diagnosis not present

## 2016-09-12 DIAGNOSIS — I272 Pulmonary hypertension, unspecified: Secondary | ICD-10-CM | POA: Diagnosis not present

## 2016-09-13 DIAGNOSIS — I2510 Atherosclerotic heart disease of native coronary artery without angina pectoris: Secondary | ICD-10-CM | POA: Diagnosis not present

## 2016-09-13 DIAGNOSIS — I10 Essential (primary) hypertension: Secondary | ICD-10-CM | POA: Diagnosis not present

## 2016-09-13 DIAGNOSIS — E782 Mixed hyperlipidemia: Secondary | ICD-10-CM | POA: Diagnosis not present

## 2016-09-13 DIAGNOSIS — I482 Chronic atrial fibrillation: Secondary | ICD-10-CM | POA: Diagnosis not present

## 2016-09-13 DIAGNOSIS — I5032 Chronic diastolic (congestive) heart failure: Secondary | ICD-10-CM | POA: Diagnosis not present

## 2016-09-18 DIAGNOSIS — I2541 Coronary artery aneurysm: Secondary | ICD-10-CM | POA: Diagnosis not present

## 2016-09-18 DIAGNOSIS — D649 Anemia, unspecified: Secondary | ICD-10-CM | POA: Diagnosis not present

## 2016-09-18 DIAGNOSIS — R0902 Hypoxemia: Secondary | ICD-10-CM | POA: Diagnosis not present

## 2016-09-21 DIAGNOSIS — I2510 Atherosclerotic heart disease of native coronary artery without angina pectoris: Secondary | ICD-10-CM | POA: Diagnosis not present

## 2016-09-21 DIAGNOSIS — I272 Pulmonary hypertension, unspecified: Secondary | ICD-10-CM | POA: Diagnosis not present

## 2016-09-21 DIAGNOSIS — I110 Hypertensive heart disease with heart failure: Secondary | ICD-10-CM | POA: Diagnosis not present

## 2016-09-21 DIAGNOSIS — I5033 Acute on chronic diastolic (congestive) heart failure: Secondary | ICD-10-CM | POA: Diagnosis not present

## 2016-09-21 DIAGNOSIS — I482 Chronic atrial fibrillation: Secondary | ICD-10-CM | POA: Diagnosis not present

## 2016-09-21 DIAGNOSIS — E1142 Type 2 diabetes mellitus with diabetic polyneuropathy: Secondary | ICD-10-CM | POA: Diagnosis not present

## 2016-09-25 DIAGNOSIS — I2510 Atherosclerotic heart disease of native coronary artery without angina pectoris: Secondary | ICD-10-CM | POA: Diagnosis not present

## 2016-09-25 DIAGNOSIS — E1142 Type 2 diabetes mellitus with diabetic polyneuropathy: Secondary | ICD-10-CM | POA: Diagnosis not present

## 2016-09-25 DIAGNOSIS — I5033 Acute on chronic diastolic (congestive) heart failure: Secondary | ICD-10-CM | POA: Diagnosis not present

## 2016-09-25 DIAGNOSIS — I110 Hypertensive heart disease with heart failure: Secondary | ICD-10-CM | POA: Diagnosis not present

## 2016-09-25 DIAGNOSIS — I482 Chronic atrial fibrillation: Secondary | ICD-10-CM | POA: Diagnosis not present

## 2016-09-25 DIAGNOSIS — I272 Pulmonary hypertension, unspecified: Secondary | ICD-10-CM | POA: Diagnosis not present

## 2016-09-27 DIAGNOSIS — I5032 Chronic diastolic (congestive) heart failure: Secondary | ICD-10-CM | POA: Diagnosis not present

## 2016-09-27 DIAGNOSIS — I482 Chronic atrial fibrillation: Secondary | ICD-10-CM | POA: Diagnosis not present

## 2016-09-27 DIAGNOSIS — I272 Pulmonary hypertension, unspecified: Secondary | ICD-10-CM | POA: Diagnosis not present

## 2016-09-27 DIAGNOSIS — E119 Type 2 diabetes mellitus without complications: Secondary | ICD-10-CM | POA: Diagnosis not present

## 2016-09-27 DIAGNOSIS — E782 Mixed hyperlipidemia: Secondary | ICD-10-CM | POA: Diagnosis not present

## 2016-09-27 DIAGNOSIS — Z79899 Other long term (current) drug therapy: Secondary | ICD-10-CM | POA: Diagnosis not present

## 2016-09-28 DIAGNOSIS — R0602 Shortness of breath: Secondary | ICD-10-CM | POA: Diagnosis not present

## 2016-09-28 DIAGNOSIS — I482 Chronic atrial fibrillation: Secondary | ICD-10-CM | POA: Diagnosis not present

## 2016-10-18 DIAGNOSIS — R0902 Hypoxemia: Secondary | ICD-10-CM | POA: Diagnosis not present

## 2016-10-18 DIAGNOSIS — I2541 Coronary artery aneurysm: Secondary | ICD-10-CM | POA: Diagnosis not present

## 2016-10-18 DIAGNOSIS — D649 Anemia, unspecified: Secondary | ICD-10-CM | POA: Diagnosis not present

## 2016-10-28 DIAGNOSIS — R0602 Shortness of breath: Secondary | ICD-10-CM | POA: Diagnosis not present

## 2016-10-28 DIAGNOSIS — I482 Chronic atrial fibrillation: Secondary | ICD-10-CM | POA: Diagnosis not present

## 2016-11-18 DIAGNOSIS — D649 Anemia, unspecified: Secondary | ICD-10-CM | POA: Diagnosis not present

## 2016-11-18 DIAGNOSIS — R0902 Hypoxemia: Secondary | ICD-10-CM | POA: Diagnosis not present

## 2016-11-18 DIAGNOSIS — I2541 Coronary artery aneurysm: Secondary | ICD-10-CM | POA: Diagnosis not present

## 2016-11-28 DIAGNOSIS — I482 Chronic atrial fibrillation: Secondary | ICD-10-CM | POA: Diagnosis not present

## 2016-11-28 DIAGNOSIS — R0602 Shortness of breath: Secondary | ICD-10-CM | POA: Diagnosis not present

## 2016-11-29 DIAGNOSIS — E119 Type 2 diabetes mellitus without complications: Secondary | ICD-10-CM | POA: Diagnosis not present

## 2016-12-19 DIAGNOSIS — I2541 Coronary artery aneurysm: Secondary | ICD-10-CM | POA: Diagnosis not present

## 2016-12-19 DIAGNOSIS — D649 Anemia, unspecified: Secondary | ICD-10-CM | POA: Diagnosis not present

## 2016-12-19 DIAGNOSIS — R0902 Hypoxemia: Secondary | ICD-10-CM | POA: Diagnosis not present

## 2016-12-21 DIAGNOSIS — Z79899 Other long term (current) drug therapy: Secondary | ICD-10-CM | POA: Diagnosis not present

## 2016-12-21 DIAGNOSIS — E119 Type 2 diabetes mellitus without complications: Secondary | ICD-10-CM | POA: Diagnosis not present

## 2016-12-21 DIAGNOSIS — H26499 Other secondary cataract, unspecified eye: Secondary | ICD-10-CM | POA: Diagnosis not present

## 2016-12-21 DIAGNOSIS — H26492 Other secondary cataract, left eye: Secondary | ICD-10-CM | POA: Diagnosis not present

## 2016-12-28 DIAGNOSIS — I5032 Chronic diastolic (congestive) heart failure: Secondary | ICD-10-CM | POA: Diagnosis not present

## 2016-12-28 DIAGNOSIS — E1121 Type 2 diabetes mellitus with diabetic nephropathy: Secondary | ICD-10-CM | POA: Diagnosis not present

## 2016-12-28 DIAGNOSIS — Z0000 Encounter for general adult medical examination without abnormal findings: Secondary | ICD-10-CM | POA: Diagnosis not present

## 2016-12-28 DIAGNOSIS — Z794 Long term (current) use of insulin: Secondary | ICD-10-CM | POA: Diagnosis not present

## 2016-12-28 DIAGNOSIS — I10 Essential (primary) hypertension: Secondary | ICD-10-CM | POA: Diagnosis not present

## 2016-12-28 DIAGNOSIS — E1140 Type 2 diabetes mellitus with diabetic neuropathy, unspecified: Secondary | ICD-10-CM | POA: Diagnosis not present

## 2016-12-28 DIAGNOSIS — I482 Chronic atrial fibrillation: Secondary | ICD-10-CM | POA: Diagnosis not present

## 2016-12-28 DIAGNOSIS — E782 Mixed hyperlipidemia: Secondary | ICD-10-CM | POA: Diagnosis not present

## 2016-12-29 DIAGNOSIS — R0602 Shortness of breath: Secondary | ICD-10-CM | POA: Diagnosis not present

## 2016-12-29 DIAGNOSIS — I482 Chronic atrial fibrillation: Secondary | ICD-10-CM | POA: Diagnosis not present

## 2017-01-18 DIAGNOSIS — R0902 Hypoxemia: Secondary | ICD-10-CM | POA: Diagnosis not present

## 2017-01-18 DIAGNOSIS — I2541 Coronary artery aneurysm: Secondary | ICD-10-CM | POA: Diagnosis not present

## 2017-01-18 DIAGNOSIS — D649 Anemia, unspecified: Secondary | ICD-10-CM | POA: Diagnosis not present

## 2017-01-28 DIAGNOSIS — I482 Chronic atrial fibrillation: Secondary | ICD-10-CM | POA: Diagnosis not present

## 2017-01-28 DIAGNOSIS — R0602 Shortness of breath: Secondary | ICD-10-CM | POA: Diagnosis not present

## 2017-02-18 DIAGNOSIS — I2541 Coronary artery aneurysm: Secondary | ICD-10-CM | POA: Diagnosis not present

## 2017-02-18 DIAGNOSIS — D649 Anemia, unspecified: Secondary | ICD-10-CM | POA: Diagnosis not present

## 2017-02-18 DIAGNOSIS — R0902 Hypoxemia: Secondary | ICD-10-CM | POA: Diagnosis not present

## 2017-02-28 DIAGNOSIS — R0602 Shortness of breath: Secondary | ICD-10-CM | POA: Diagnosis not present

## 2017-02-28 DIAGNOSIS — I482 Chronic atrial fibrillation: Secondary | ICD-10-CM | POA: Diagnosis not present

## 2017-03-15 DIAGNOSIS — I2510 Atherosclerotic heart disease of native coronary artery without angina pectoris: Secondary | ICD-10-CM | POA: Diagnosis not present

## 2017-03-15 DIAGNOSIS — E7801 Familial hypercholesterolemia: Secondary | ICD-10-CM | POA: Diagnosis not present

## 2017-03-15 DIAGNOSIS — I10 Essential (primary) hypertension: Secondary | ICD-10-CM | POA: Diagnosis not present

## 2017-03-15 DIAGNOSIS — I482 Chronic atrial fibrillation: Secondary | ICD-10-CM | POA: Diagnosis not present

## 2017-03-15 DIAGNOSIS — I5032 Chronic diastolic (congestive) heart failure: Secondary | ICD-10-CM | POA: Diagnosis not present

## 2017-03-20 DIAGNOSIS — I2541 Coronary artery aneurysm: Secondary | ICD-10-CM | POA: Diagnosis not present

## 2017-03-20 DIAGNOSIS — R0902 Hypoxemia: Secondary | ICD-10-CM | POA: Diagnosis not present

## 2017-03-20 DIAGNOSIS — D649 Anemia, unspecified: Secondary | ICD-10-CM | POA: Diagnosis not present

## 2017-03-30 DIAGNOSIS — I482 Chronic atrial fibrillation: Secondary | ICD-10-CM | POA: Diagnosis not present

## 2017-03-30 DIAGNOSIS — R0602 Shortness of breath: Secondary | ICD-10-CM | POA: Diagnosis not present

## 2017-04-02 DIAGNOSIS — S4991XA Unspecified injury of right shoulder and upper arm, initial encounter: Secondary | ICD-10-CM | POA: Diagnosis not present

## 2017-04-02 DIAGNOSIS — E119 Type 2 diabetes mellitus without complications: Secondary | ICD-10-CM | POA: Diagnosis not present

## 2017-04-02 DIAGNOSIS — I5032 Chronic diastolic (congestive) heart failure: Secondary | ICD-10-CM | POA: Diagnosis not present

## 2017-04-02 DIAGNOSIS — E7801 Familial hypercholesterolemia: Secondary | ICD-10-CM | POA: Diagnosis not present

## 2017-04-02 DIAGNOSIS — M79601 Pain in right arm: Secondary | ICD-10-CM | POA: Diagnosis not present

## 2017-04-02 DIAGNOSIS — Z79899 Other long term (current) drug therapy: Secondary | ICD-10-CM | POA: Diagnosis not present

## 2017-04-02 DIAGNOSIS — I272 Pulmonary hypertension, unspecified: Secondary | ICD-10-CM | POA: Diagnosis not present

## 2017-04-02 DIAGNOSIS — M25511 Pain in right shoulder: Secondary | ICD-10-CM | POA: Diagnosis not present

## 2017-04-02 DIAGNOSIS — I2510 Atherosclerotic heart disease of native coronary artery without angina pectoris: Secondary | ICD-10-CM | POA: Diagnosis not present

## 2017-04-02 DIAGNOSIS — I10 Essential (primary) hypertension: Secondary | ICD-10-CM | POA: Diagnosis not present

## 2017-04-20 DIAGNOSIS — I2541 Coronary artery aneurysm: Secondary | ICD-10-CM | POA: Diagnosis not present

## 2017-04-20 DIAGNOSIS — R0902 Hypoxemia: Secondary | ICD-10-CM | POA: Diagnosis not present

## 2017-04-20 DIAGNOSIS — D649 Anemia, unspecified: Secondary | ICD-10-CM | POA: Diagnosis not present

## 2017-04-24 ENCOUNTER — Emergency Department (HOSPITAL_COMMUNITY): Payer: Medicare HMO

## 2017-04-24 ENCOUNTER — Inpatient Hospital Stay (HOSPITAL_COMMUNITY)
Admission: EM | Admit: 2017-04-24 | Discharge: 2017-05-18 | DRG: 064 | Disposition: E | Payer: Medicare HMO | Attending: Neurology | Admitting: Neurology

## 2017-04-24 DIAGNOSIS — Z794 Long term (current) use of insulin: Secondary | ICD-10-CM

## 2017-04-24 DIAGNOSIS — Z7982 Long term (current) use of aspirin: Secondary | ICD-10-CM

## 2017-04-24 DIAGNOSIS — Z66 Do not resuscitate: Secondary | ICD-10-CM | POA: Diagnosis present

## 2017-04-24 DIAGNOSIS — R4781 Slurred speech: Secondary | ICD-10-CM | POA: Diagnosis present

## 2017-04-24 DIAGNOSIS — I6523 Occlusion and stenosis of bilateral carotid arteries: Secondary | ICD-10-CM | POA: Diagnosis not present

## 2017-04-24 DIAGNOSIS — E1122 Type 2 diabetes mellitus with diabetic chronic kidney disease: Secondary | ICD-10-CM | POA: Diagnosis present

## 2017-04-24 DIAGNOSIS — E7800 Pure hypercholesterolemia, unspecified: Secondary | ICD-10-CM | POA: Diagnosis present

## 2017-04-24 DIAGNOSIS — E1151 Type 2 diabetes mellitus with diabetic peripheral angiopathy without gangrene: Secondary | ICD-10-CM | POA: Diagnosis present

## 2017-04-24 DIAGNOSIS — G8194 Hemiplegia, unspecified affecting left nondominant side: Secondary | ICD-10-CM | POA: Diagnosis present

## 2017-04-24 DIAGNOSIS — I161 Hypertensive emergency: Secondary | ICD-10-CM | POA: Diagnosis not present

## 2017-04-24 DIAGNOSIS — I5032 Chronic diastolic (congestive) heart failure: Secondary | ICD-10-CM | POA: Diagnosis present

## 2017-04-24 DIAGNOSIS — Z79899 Other long term (current) drug therapy: Secondary | ICD-10-CM

## 2017-04-24 DIAGNOSIS — R29720 NIHSS score 20: Secondary | ICD-10-CM | POA: Diagnosis present

## 2017-04-24 DIAGNOSIS — R1310 Dysphagia, unspecified: Secondary | ICD-10-CM | POA: Diagnosis present

## 2017-04-24 DIAGNOSIS — Z9981 Dependence on supplemental oxygen: Secondary | ICD-10-CM

## 2017-04-24 DIAGNOSIS — Z86718 Personal history of other venous thrombosis and embolism: Secondary | ICD-10-CM

## 2017-04-24 DIAGNOSIS — G936 Cerebral edema: Secondary | ICD-10-CM | POA: Diagnosis present

## 2017-04-24 DIAGNOSIS — H53462 Homonymous bilateral field defects, left side: Secondary | ICD-10-CM | POA: Diagnosis present

## 2017-04-24 DIAGNOSIS — I169 Hypertensive crisis, unspecified: Secondary | ICD-10-CM | POA: Diagnosis present

## 2017-04-24 DIAGNOSIS — I63411 Cerebral infarction due to embolism of right middle cerebral artery: Secondary | ICD-10-CM | POA: Diagnosis not present

## 2017-04-24 DIAGNOSIS — I130 Hypertensive heart and chronic kidney disease with heart failure and stage 1 through stage 4 chronic kidney disease, or unspecified chronic kidney disease: Secondary | ICD-10-CM | POA: Diagnosis present

## 2017-04-24 DIAGNOSIS — J449 Chronic obstructive pulmonary disease, unspecified: Secondary | ICD-10-CM | POA: Diagnosis present

## 2017-04-24 DIAGNOSIS — I272 Pulmonary hypertension, unspecified: Secondary | ICD-10-CM | POA: Diagnosis present

## 2017-04-24 DIAGNOSIS — I639 Cerebral infarction, unspecified: Secondary | ICD-10-CM

## 2017-04-24 DIAGNOSIS — I63511 Cerebral infarction due to unspecified occlusion or stenosis of right middle cerebral artery: Secondary | ICD-10-CM

## 2017-04-24 DIAGNOSIS — Z515 Encounter for palliative care: Secondary | ICD-10-CM

## 2017-04-24 DIAGNOSIS — E785 Hyperlipidemia, unspecified: Secondary | ICD-10-CM | POA: Diagnosis present

## 2017-04-24 DIAGNOSIS — E1142 Type 2 diabetes mellitus with diabetic polyneuropathy: Secondary | ICD-10-CM | POA: Diagnosis present

## 2017-04-24 DIAGNOSIS — I4891 Unspecified atrial fibrillation: Secondary | ICD-10-CM | POA: Diagnosis not present

## 2017-04-24 DIAGNOSIS — R29818 Other symptoms and signs involving the nervous system: Secondary | ICD-10-CM | POA: Diagnosis not present

## 2017-04-24 DIAGNOSIS — E669 Obesity, unspecified: Secondary | ICD-10-CM | POA: Diagnosis present

## 2017-04-24 DIAGNOSIS — R29810 Facial weakness: Secondary | ICD-10-CM | POA: Diagnosis not present

## 2017-04-24 DIAGNOSIS — J9611 Chronic respiratory failure with hypoxia: Secondary | ICD-10-CM | POA: Diagnosis present

## 2017-04-24 DIAGNOSIS — I619 Nontraumatic intracerebral hemorrhage, unspecified: Secondary | ICD-10-CM | POA: Diagnosis present

## 2017-04-24 DIAGNOSIS — R4182 Altered mental status, unspecified: Secondary | ICD-10-CM | POA: Diagnosis not present

## 2017-04-24 DIAGNOSIS — N183 Chronic kidney disease, stage 3 (moderate): Secondary | ICD-10-CM | POA: Diagnosis present

## 2017-04-24 DIAGNOSIS — R414 Neurologic neglect syndrome: Secondary | ICD-10-CM | POA: Diagnosis present

## 2017-04-24 DIAGNOSIS — Z6833 Body mass index (BMI) 33.0-33.9, adult: Secondary | ICD-10-CM

## 2017-04-24 DIAGNOSIS — Z90710 Acquired absence of both cervix and uterus: Secondary | ICD-10-CM

## 2017-04-24 DIAGNOSIS — I2510 Atherosclerotic heart disease of native coronary artery without angina pectoris: Secondary | ICD-10-CM | POA: Diagnosis present

## 2017-04-24 DIAGNOSIS — Z8249 Family history of ischemic heart disease and other diseases of the circulatory system: Secondary | ICD-10-CM

## 2017-04-24 DIAGNOSIS — J90 Pleural effusion, not elsewhere classified: Secondary | ICD-10-CM | POA: Diagnosis not present

## 2017-04-24 DIAGNOSIS — I6389 Other cerebral infarction: Secondary | ICD-10-CM

## 2017-04-24 DIAGNOSIS — H5500 Unspecified nystagmus: Secondary | ICD-10-CM | POA: Diagnosis present

## 2017-04-24 DIAGNOSIS — Z823 Family history of stroke: Secondary | ICD-10-CM

## 2017-04-24 DIAGNOSIS — G4733 Obstructive sleep apnea (adult) (pediatric): Secondary | ICD-10-CM | POA: Diagnosis present

## 2017-04-24 HISTORY — DX: Acute embolism and thrombosis of unspecified deep veins of unspecified lower extremity: I82.409

## 2017-04-24 LAB — COMPREHENSIVE METABOLIC PANEL
ALBUMIN: 3.6 g/dL (ref 3.5–5.0)
ALK PHOS: 88 U/L (ref 38–126)
ALT: 15 U/L (ref 14–54)
AST: 30 U/L (ref 15–41)
Anion gap: 11 (ref 5–15)
BUN: 29 mg/dL — ABNORMAL HIGH (ref 6–20)
CALCIUM: 8.6 mg/dL — AB (ref 8.9–10.3)
CO2: 22 mmol/L (ref 22–32)
CREATININE: 1.15 mg/dL — AB (ref 0.44–1.00)
Chloride: 104 mmol/L (ref 101–111)
GFR calc Af Amer: 47 mL/min — ABNORMAL LOW (ref >60)
GFR calc non Af Amer: 41 mL/min — ABNORMAL LOW (ref >60)
GLUCOSE: 177 mg/dL — AB (ref 65–99)
Potassium: 4.6 mmol/L (ref 3.5–5.1)
SODIUM: 137 mmol/L (ref 135–145)
Total Bilirubin: 1.1 mg/dL (ref 0.3–1.2)
Total Protein: 7.1 g/dL (ref 6.5–8.1)

## 2017-04-24 LAB — CBC
HCT: 43.3 % (ref 36.0–46.0)
Hemoglobin: 13 g/dL (ref 12.0–15.0)
MCH: 28.4 pg (ref 26.0–34.0)
MCHC: 30 g/dL (ref 30.0–36.0)
MCV: 94.5 fL (ref 78.0–100.0)
PLATELETS: 177 10*3/uL (ref 150–400)
RBC: 4.58 MIL/uL (ref 3.87–5.11)
RDW: 14.2 % (ref 11.5–15.5)
WBC: 7.3 10*3/uL (ref 4.0–10.5)

## 2017-04-24 LAB — DIFFERENTIAL
Basophils Absolute: 0 10*3/uL (ref 0.0–0.1)
Basophils Relative: 0 %
Eosinophils Absolute: 0 10*3/uL (ref 0.0–0.7)
Eosinophils Relative: 0 %
LYMPHS PCT: 28 %
Lymphs Abs: 2 10*3/uL (ref 0.7–4.0)
Monocytes Absolute: 0.4 10*3/uL (ref 0.1–1.0)
Monocytes Relative: 5 %
NEUTROS PCT: 67 %
Neutro Abs: 4.9 10*3/uL (ref 1.7–7.7)

## 2017-04-24 LAB — I-STAT CHEM 8, ED
BUN: 38 mg/dL — ABNORMAL HIGH (ref 6–20)
CALCIUM ION: 0.99 mmol/L — AB (ref 1.15–1.40)
CREATININE: 1 mg/dL (ref 0.44–1.00)
Chloride: 105 mmol/L (ref 101–111)
Glucose, Bld: 177 mg/dL — ABNORMAL HIGH (ref 65–99)
HCT: 43 % (ref 36.0–46.0)
Hemoglobin: 14.6 g/dL (ref 12.0–15.0)
Potassium: 4.6 mmol/L (ref 3.5–5.1)
SODIUM: 140 mmol/L (ref 135–145)
TCO2: 25 mmol/L (ref 22–32)

## 2017-04-24 LAB — I-STAT TROPONIN, ED: Troponin i, poc: 0.15 ng/mL (ref 0.00–0.08)

## 2017-04-24 LAB — PROTIME-INR
INR: 1.07
PROTHROMBIN TIME: 13.8 s (ref 11.4–15.2)

## 2017-04-24 LAB — ETHANOL: Alcohol, Ethyl (B): <10 mg/dL (ref <10)

## 2017-04-24 LAB — APTT: aPTT: 26 seconds (ref 24–36)

## 2017-04-24 MED ORDER — NICARDIPINE HCL IN NACL 20-0.86 MG/200ML-% IV SOLN
INTRAVENOUS | Status: AC
  Filled 2017-04-24: qty 200

## 2017-04-24 MED ORDER — NICARDIPINE HCL IN NACL 20-0.86 MG/200ML-% IV SOLN: 3.0000 mg/h | INTRAVENOUS | Status: DC

## 2017-04-24 MED ORDER — METOPROLOL TARTRATE 5 MG/5ML IV SOLN
5.0000 mg | Freq: Once | INTRAVENOUS | Status: AC
Start: 2017-04-24 — End: 2017-04-24
  Administered 2017-04-24: 2.5 mg via INTRAVENOUS

## 2017-04-24 MED ORDER — IOPAMIDOL (ISOVUE-370) INJECTION 76%
INTRAVENOUS | Status: AC
  Administered 2017-04-24: 100 mL
  Filled 2017-04-24: qty 100

## 2017-04-24 NOTE — ED Notes (Signed)
ED Provider at bedside. 

## 2017-04-24 NOTE — ED Notes (Signed)
[place], MD at bedside. 

## 2017-04-24 NOTE — ED Provider Notes (Signed)
[facility]ERGENCY DEPARTMENT Provider Note   CSN: [id number] Arrival date & time: 05/10/2017  2221   LEVEL 5 CAVEAT - ACUITY OF SITUATION  History   Chief Complaint Chief Complaint  Patient presents with  . Weakness    HPI Pamela Chaney is a 84 y.o. female.  HPI  84 year old female with a history of atrial fibrillation, hypertension, and diabetes presents with strokelike symptoms.  A code stroke was called by EMS.  History is limited on arrival but it seems that her last known normal was 6 PM.  Found by neighbors with left-sided weakness.  EMS noticed she has had tachycardia and severe hypertension with a blood pressure of 240/140.  She is neglecting the left side and having shortness of breath.  She was found to have oxygen saturations in the 80s on room air.  It is unknown if she is on a blood thinner.  Past Medical History:  Diagnosis Date  . Atrial fibrillation ([place])   . Cancer ([place])    colon  . Coronary artery disease   . Diabetes mellitus without complication ([place])   . High cholesterol   . Hypertension   . Peripheral neuropathy   . Pulmonary hypertension [facility])     Patient Active Problem List   Diagnosis Date Noted  . CHF (congestive heart failure) ([place]) 08/10/2016    Past Surgical History:  Procedure Laterality Date  . ABDOMINAL HYSTERECTOMY    . EXPLORATION POST OPERATIVE OPEN HEART      OB History    No data available       Home Medications    Prior to Admission medications   Medication Sig Start Date End Date Taking? Authorizing Provider  acetaminophen (TYLENOL) 500 MG tablet Take 1 tablet by mouth every 6 (six) hours as needed.    [provider]  aspirin EC 325 MG tablet Take 325 tablets by mouth daily.     [provider]  furosemide (LASIX) 40 MG tablet Take 1 tablet (40 mg total) by mouth daily. 08/12/16   Fritzi Mandes, MD  glipiZIDE (GLUCOTROL) 5 MG tablet Take 1 tablet by mouth 2 (two) times daily. 06/17/14    [provider]  HUMULIN 70/30 KWIKPEN (70-30) 100 UNIT/ML PEN Inject 20 Units into the skin 2 (two) times daily.  08/31/14   [provider]  metoprolol tartrate (LOPRESSOR) 25 MG tablet Take 12.5 mg by mouth 2 (two) times daily.  08/31/14   [provider]  PARoxetine (PAXIL) 20 MG tablet Take 20 mg by mouth daily.  08/31/14   [provider]  Simethicone (GAS RELIEF PO) Take 1 tablet by mouth daily as needed.  09/17/14   [provider]  traZODone (DESYREL) 50 MG tablet Take 25 mg by mouth daily. 09/15/15   [provider]    Family History Family History  Problem Relation Age of Onset  . Pancreatic cancer Mother   . Heart attack Father   . Colon cancer Sister     Social History Social History   Tobacco Use  . Smoking status: Never Smoker  . Smokeless tobacco: Never Used  Substance Use Topics  . Alcohol use: No  . Drug use: No     Allergies   Lisinopril and Morphine and related   Review of Systems Review of Systems  Unable to perform ROS: Acuity of condition     Physical Exam Updated Vital Signs BP (!) 156/97   Pulse 99   Resp (!)  21   SpO2 94%   Physical Exam  Constitutional: She appears well-developed and well-nourished.  HENT:  Head: Normocephalic and atraumatic.  Right Ear: External ear normal.  Left Ear: External ear normal.  Nose: Nose normal.  Eyes: Pupils are equal, round, and reactive to light. Right eye exhibits no discharge. Left eye exhibits no discharge.  Neck: Neck supple.  Cardiovascular: Normal heart sounds. An irregular rhythm present. Tachycardia present.  Pulmonary/Chest: No accessory muscle usage. Tachypnea noted. No respiratory distress.  Abdominal: Soft. There is no tenderness.  Neurological: She is alert.  Awake and alert.  She does not have significant dysarthria.  Right upper extremity and right lower extremity with 5/5 strength.  She has a left-sided facial droop and dense  hemiparesis of left arm and leg.  Skin: Skin is warm and dry.  Nursing note and vitals reviewed.    ED Treatments / Results  Labs (all labs ordered are listed, but only abnormal results are displayed) Labs Reviewed  COMPREHENSIVE METABOLIC PANEL - Abnormal; Notable for the following components:      Result Value   Glucose, Bld 177 (*)    BUN 29 (*)    Creatinine, Ser 1.15 (*)    Calcium 8.6 (*)    GFR calc non Af Amer 41 (*)    GFR calc Af Amer 47 (*)    All other components within normal limits  BRAIN NATRIURETIC PEPTIDE - Abnormal; Notable for the following components:   B Natriuretic Peptide 145.1 (*)    All other components within normal limits  I-STAT CHEM 8, ED - Abnormal; Notable for the following components:   BUN 38 (*)    Glucose, Bld 177 (*)    Calcium, Ion 0.99 (*)    All other components within normal limits  I-STAT TROPONIN, ED - Abnormal; Notable for the following components:   Troponin i, poc 0.15 (*)    All other components within normal limits  ETHANOL  PROTIME-INR  APTT  CBC  DIFFERENTIAL  RAPID URINE DRUG SCREEN, HOSP PERFORMED  URINALYSIS, ROUTINE W REFLEX MICROSCOPIC  BLOOD GAS, ARTERIAL    EKG  EKG Interpretation  Date/Time:  Tuesday April 24 2017 23:16:24 EST Ventricular Rate:  126 PR Interval:    QRS Duration: 97 QT Interval:  374 QTC Calculation: 542 R Axis:   -94 Text Interpretation:  atrial fibrillation with RVR Inferior infarct, old Consider anterior infarct Prolonged QT interval Baseline wander in lead(s) V2 Confirmed by Sherwood Gambler [phone]) on 05/02/2017 11:31:15 PM       Radiology Ct Angio Head W Or Wo Contrast  Result Date: 05/14/2017 EXAM: CT ANGIOGRAPHY HEAD AND NECK CT PERFUSION BRAIN TECHNIQUE: Multidetector CT imaging of the head and neck was performed using the standard protocol during bolus administration of intravenous contrast. Multiplanar CT image reconstructions and MIPs were obtained to evaluate the vascular  anatomy. Carotid stenosis measurements (when applicable) are obtained utilizing NASCET criteria, using the distal internal carotid diameter as the denominator. Multiphase CT imaging of the brain was performed following IV bolus contrast injection. Subsequent parametric perfusion maps were calculated using RAPID software. CONTRAST:  195mL ISOVUE-370 IOPAMIDOL (ISOVUE-370) INJECTION 76% COMPARISON:  Head CT 05/13/2017, 11/18/2014 FINDINGS: CTA NECK FINDINGS Aortic arch: There is mild calcific atherosclerosis of the aortic arch. There is no aneurysm, dissection or hemodynamically significant stenosis of the visualized ascending aorta and aortic arch. Conventional 3 vessel aortic branching pattern. There is atherosclerotic calcification within both proximal subclavian arteries without hemodynamically significant stenosis.  Right carotid system: The right common carotid origin is widely patent. There is no common carotid or internal carotid artery dissection or aneurysm. There is atherosclerotic calcification of the carotid bifurcation without hemodynamically significant stenosis. Left carotid system: The left common carotid origin is widely patent. There is no common carotid or internal carotid artery dissection or aneurysm. There is atherosclerotic calcification at the carotid bifurcation without hemodynamically significant stenosis. Vertebral arteries: The vertebral system is left dominant. Both vertebral artery origins are patent. There is atherosclerotic calcification of the right vertebral origin. The right vertebral artery terminates in the posterior inferior cerebellar artery. Skeleton: There is no bony spinal canal stenosis. No lytic or blastic lesions. Other neck: Patulous cervical esophagus. No lymphadenopathy. Normal salivary glands. Normal pharynx and larynx. Heterogeneous thyroid gland. Upper chest: No pneumothorax or pleural effusion. No nodules or masses. Review of the MIP images confirms the above  findings CTA HEAD FINDINGS Anterior circulation: --Intracranial internal carotid arteries: Atherosclerotic calcification bilaterally without hemodynamically significant stenosis. --Anterior cerebral arteries: Normal. --Middle cerebral arteries: The right middle cerebral artery is occluded at the M1/M 2 junction. There is persistent opacification of the inferior division and its distal branches. The left middle cerebral artery is normal. --Posterior communicating arteries: Present bilaterally. Posterior circulation: --Posterior cerebral arteries: Normal. --Superior cerebellar arteries: Normal. --Basilar artery: Normal. --Anterior inferior cerebellar arteries: Normal. --Posterior inferior cerebellar arteries: Normal. Venous sinuses: As permitted by contrast timing, patent. Anatomic variants: Bilateral posterior cerebral artery fetal origins. Right PICA arises from the termination of the right vertebral artery. Delayed phase: No parenchymal contrast enhancement. Petechial hemorrhage within the distribution of the infarction. Possible small focus of subarachnoid blood in the interhemispheric fissure. Review of the MIP images confirms the above findings. CT Brain Perfusion Findings: CBF (<30%) Volume: 32mL Perfusion (Tmax>6.0s) volume: 8mL Mismatch Volume: 42mL Infarction Location:Midportion of the right middle cerebral artery. The penumbra is primarily in the posterior zone of the middle cerebral artery distribution IMPRESSION: 1. Occlusion of the right middle cerebral artery at the M1/M2 junction with complete loss of opacification of the right M2 superior division. 2. Right middle cerebral artery distribution infarction with ischemic penumbra volume measuring 51 mL, primarily concentrated in the posterior zone of the right MCA distribution. 3. Redemonstration of petechial hemorrhage in the distribution of the infarct, unchanged. Small focus of possible subarachnoid blood in the anterior interhemispheric fissure is  also unchanged. 4. No hemodynamically significant stenosis of the carotid or vertebral arteries. 5.  Aortic Atherosclerosis (ICD10-I70.0). Critical Value/emergent results were called by telephone at the time of interpretation on 04/22/2017 at 11:53 pm to Dr. Amie Portland , who verbally acknowledged these results. Electronically Signed   By: Ulyses Jarred M.D.   On: 05/09/2017 23:54   Ct Angio Neck W Or Wo Contrast  Result Date: 05/15/2017 EXAM: CT ANGIOGRAPHY HEAD AND NECK CT PERFUSION BRAIN TECHNIQUE: Multidetector CT imaging of the head and neck was performed using the standard protocol during bolus administration of intravenous contrast. Multiplanar CT image reconstructions and MIPs were obtained to evaluate the vascular anatomy. Carotid stenosis measurements (when applicable) are obtained utilizing NASCET criteria, using the distal internal carotid diameter as the denominator. Multiphase CT imaging of the brain was performed following IV bolus contrast injection. Subsequent parametric perfusion maps were calculated using RAPID software. CONTRAST:  152mL ISOVUE-370 IOPAMIDOL (ISOVUE-370) INJECTION 76% COMPARISON:  Head CT 05/14/2017, 11/18/2014 FINDINGS: CTA NECK FINDINGS Aortic arch: There is mild calcific atherosclerosis of the aortic arch. There is no aneurysm, dissection or hemodynamically  significant stenosis of the visualized ascending aorta and aortic arch. Conventional 3 vessel aortic branching pattern. There is atherosclerotic calcification within both proximal subclavian arteries without hemodynamically significant stenosis. Right carotid system: The right common carotid origin is widely patent. There is no common carotid or internal carotid artery dissection or aneurysm. There is atherosclerotic calcification of the carotid bifurcation without hemodynamically significant stenosis. Left carotid system: The left common carotid origin is widely patent. There is no common carotid or internal carotid  artery dissection or aneurysm. There is atherosclerotic calcification at the carotid bifurcation without hemodynamically significant stenosis. Vertebral arteries: The vertebral system is left dominant. Both vertebral artery origins are patent. There is atherosclerotic calcification of the right vertebral origin. The right vertebral artery terminates in the posterior inferior cerebellar artery. Skeleton: There is no bony spinal canal stenosis. No lytic or blastic lesions. Other neck: Patulous cervical esophagus. No lymphadenopathy. Normal salivary glands. Normal pharynx and larynx. Heterogeneous thyroid gland. Upper chest: No pneumothorax or pleural effusion. No nodules or masses. Review of the MIP images confirms the above findings CTA HEAD FINDINGS Anterior circulation: --Intracranial internal carotid arteries: Atherosclerotic calcification bilaterally without hemodynamically significant stenosis. --Anterior cerebral arteries: Normal. --Middle cerebral arteries: The right middle cerebral artery is occluded at the M1/M 2 junction. There is persistent opacification of the inferior division and its distal branches. The left middle cerebral artery is normal. --Posterior communicating arteries: Present bilaterally. Posterior circulation: --Posterior cerebral arteries: Normal. --Superior cerebellar arteries: Normal. --Basilar artery: Normal. --Anterior inferior cerebellar arteries: Normal. --Posterior inferior cerebellar arteries: Normal. Venous sinuses: As permitted by contrast timing, patent. Anatomic variants: Bilateral posterior cerebral artery fetal origins. Right PICA arises from the termination of the right vertebral artery. Delayed phase: No parenchymal contrast enhancement. Petechial hemorrhage within the distribution of the infarction. Possible small focus of subarachnoid blood in the interhemispheric fissure. Review of the MIP images confirms the above findings. CT Brain Perfusion Findings: CBF (<30%) Volume:  41mL Perfusion (Tmax>6.0s) volume: 47mL Mismatch Volume: 71mL Infarction Location:Midportion of the right middle cerebral artery. The penumbra is primarily in the posterior zone of the middle cerebral artery distribution IMPRESSION: 1. Occlusion of the right middle cerebral artery at the M1/M2 junction with complete loss of opacification of the right M2 superior division. 2. Right middle cerebral artery distribution infarction with ischemic penumbra volume measuring 51 mL, primarily concentrated in the posterior zone of the right MCA distribution. 3. Redemonstration of petechial hemorrhage in the distribution of the infarct, unchanged. Small focus of possible subarachnoid blood in the anterior interhemispheric fissure is also unchanged. 4. No hemodynamically significant stenosis of the carotid or vertebral arteries. 5.  Aortic Atherosclerosis (ICD10-I70.0). Critical Value/emergent results were called by telephone at the time of interpretation on 04/20/2017 at 11:53 pm to Dr. Amie Portland , who verbally acknowledged these results. Electronically Signed   By: Ulyses Jarred M.D.   On: 04/30/2017 23:54   Ct Cerebral Perfusion W Contrast  Result Date: 05/11/2017 EXAM: CT ANGIOGRAPHY HEAD AND NECK CT PERFUSION BRAIN TECHNIQUE: Multidetector CT imaging of the head and neck was performed using the standard protocol during bolus administration of intravenous contrast. Multiplanar CT image reconstructions and MIPs were obtained to evaluate the vascular anatomy. Carotid stenosis measurements (when applicable) are obtained utilizing NASCET criteria, using the distal internal carotid diameter as the denominator. Multiphase CT imaging of the brain was performed following IV bolus contrast injection. Subsequent parametric perfusion maps were calculated using RAPID software. CONTRAST:  169mL ISOVUE-370 IOPAMIDOL (ISOVUE-370) INJECTION 76% COMPARISON:  Head CT 05/16/2017, 11/18/2014 FINDINGS: CTA NECK FINDINGS Aortic arch: There is  mild calcific atherosclerosis of the aortic arch. There is no aneurysm, dissection or hemodynamically significant stenosis of the visualized ascending aorta and aortic arch. Conventional 3 vessel aortic branching pattern. There is atherosclerotic calcification within both proximal subclavian arteries without hemodynamically significant stenosis. Right carotid system: The right common carotid origin is widely patent. There is no common carotid or internal carotid artery dissection or aneurysm. There is atherosclerotic calcification of the carotid bifurcation without hemodynamically significant stenosis. Left carotid system: The left common carotid origin is widely patent. There is no common carotid or internal carotid artery dissection or aneurysm. There is atherosclerotic calcification at the carotid bifurcation without hemodynamically significant stenosis. Vertebral arteries: The vertebral system is left dominant. Both vertebral artery origins are patent. There is atherosclerotic calcification of the right vertebral origin. The right vertebral artery terminates in the posterior inferior cerebellar artery. Skeleton: There is no bony spinal canal stenosis. No lytic or blastic lesions. Other neck: Patulous cervical esophagus. No lymphadenopathy. Normal salivary glands. Normal pharynx and larynx. Heterogeneous thyroid gland. Upper chest: No pneumothorax or pleural effusion. No nodules or masses. Review of the MIP images confirms the above findings CTA HEAD FINDINGS Anterior circulation: --Intracranial internal carotid arteries: Atherosclerotic calcification bilaterally without hemodynamically significant stenosis. --Anterior cerebral arteries: Normal. --Middle cerebral arteries: The right middle cerebral artery is occluded at the M1/M 2 junction. There is persistent opacification of the inferior division and its distal branches. The left middle cerebral artery is normal. --Posterior communicating arteries: Present  bilaterally. Posterior circulation: --Posterior cerebral arteries: Normal. --Superior cerebellar arteries: Normal. --Basilar artery: Normal. --Anterior inferior cerebellar arteries: Normal. --Posterior inferior cerebellar arteries: Normal. Venous sinuses: As permitted by contrast timing, patent. Anatomic variants: Bilateral posterior cerebral artery fetal origins. Right PICA arises from the termination of the right vertebral artery. Delayed phase: No parenchymal contrast enhancement. Petechial hemorrhage within the distribution of the infarction. Possible small focus of subarachnoid blood in the interhemispheric fissure. Review of the MIP images confirms the above findings. CT Brain Perfusion Findings: CBF (<30%) Volume: 52mL Perfusion (Tmax>6.0s) volume: 14mL Mismatch Volume: 56mL Infarction Location:Midportion of the right middle cerebral artery. The penumbra is primarily in the posterior zone of the middle cerebral artery distribution IMPRESSION: 1. Occlusion of the right middle cerebral artery at the M1/M2 junction with complete loss of opacification of the right M2 superior division. 2. Right middle cerebral artery distribution infarction with ischemic penumbra volume measuring 51 mL, primarily concentrated in the posterior zone of the right MCA distribution. 3. Redemonstration of petechial hemorrhage in the distribution of the infarct, unchanged. Small focus of possible subarachnoid blood in the anterior interhemispheric fissure is also unchanged. 4. No hemodynamically significant stenosis of the carotid or vertebral arteries. 5.  Aortic Atherosclerosis (ICD10-I70.0). Critical Value/emergent results were called by telephone at the time of interpretation on 04/21/2017 at 11:53 pm to Dr. Amie Portland , who verbally acknowledged these results. Electronically Signed   By: Ulyses Jarred M.D.   On: 04/19/2017 23:54   Ct Head Code Stroke Wo Contrast  Result Date: 04/27/2017 CLINICAL DATA:  Code stroke. Right-sided  facial droop. Other left-sided deficits. EXAM: CT HEAD WITHOUT CONTRAST TECHNIQUE: Contiguous axial images were obtained from the base of the skull through the vertex without intravenous contrast. COMPARISON:  Head CT 11/18/2014 FINDINGS: Brain: There is hypoattenuation throughout the posterior right MCA distribution. There are small areas of petechial hemorrhage measuring up to 6 mm. There is no  space-occupying hematoma. A calcified meningioma along the right frontal convexity has slightly increased in size, now measuring 2.8 cm, previously 2.4 cm. No significant mass effect on the underlying brain. Focal area of encephalomalacia in the right frontal lobe is unchanged. There is no midline shift or mass effect. There is an old left cerebellar infarct. Size and configuration of the ventricles are normal. Vascular: Symmetric vascular density. Skull: No calvarial or skull base abnormality. Sinuses/Orbits: No acute finding. Other: None. ASPECTS [facility] Stroke Program Early CT Score) - Ganglionic level infarction (caudate, lentiform nuclei, internal capsule, insula, M1-M3 cortex): 0 - Supraganglionic infarction (M4-M6 cortex): 3 Total score (0-10 with 10 being normal): 3 IMPRESSION: 1. Posterior right MCA territory acute infarction with areas of petechial hemorrhage measuring up to 6.5 mm. No associated midline shift. 2. ASPECTS is 3. 3. Slight increase in size of partially calcified right frontal convexity meningioma without edema or other focal abnormality of the underlying brain. Critical Value/emergent results were called by telephone at the time of interpretation on 05/16/2017 at 10:57 pm to Dr. Rory Percy , who verbally acknowledged these results. Electronically Signed   By: Ulyses Jarred M.D.   On: 05/06/2017 23:01    Procedures .Critical Care Performed by: Sherwood Gambler, MD Authorized by: Sherwood Gambler, MD   Critical care provider statement:    Critical care time (minutes):  30   Critical care was  necessary to treat or prevent imminent or life-threatening deterioration of the following conditions:  CNS failure or compromise and respiratory failure   Critical care was time spent personally by me on the following activities:  Development of treatment plan with patient or surrogate, discussions with consultants, evaluation of patient's response to treatment, examination of patient, obtaining history from patient or surrogate, ordering and performing treatments and interventions, ordering and review of laboratory studies, ordering and review of radiographic studies, pulse oximetry, re-evaluation of patient's condition and review of old charts   (including critical care time)  Medications Ordered in ED Medications  niCARdipine in saline (CARDENE-IV) 20-0.86 MG/200ML-% infusion SOLN (not administered)  nicardipine (CARDENE) 20mg  in 0.86% saline 232ml IV infusion (0.1 mg/ml) (not administered)  metoprolol tartrate (LOPRESSOR) injection 5 mg (2.5 mg Intravenous Given by Other 04/23/2017 2315)  iopamidol (ISOVUE-370) 76 % injection (100 mLs  Contrast Given 04/23/2017 2244)     Initial Impression / Assessment and Plan / ED Course  I have reviewed the triage vital signs and the nursing notes.  Pertinent labs & imaging results that were available during my care of the patient were reviewed by me and considered in my medical decision making (see chart for details).     Patient arrives very hypertensive and with A. fib with RVR.  She is not on a blood thinner stronger than aspirin which is likely contributing to her acute MCA stroke.  However she is out of the TPA window and is not an intervention candidate per neurology.  After IV metoprolol in the CT scanner, heart rate is better controlled although still intermittently tachycardic.  Her blood pressure is better and under 322 systolic, her goal.  She is awake and alert but with a significant stroke.  She and family agrees she would not want CPR.  However she  would want temporary intubation if needed.  Neurology is asking for patient to be admitted to the ICU, medical ICU given his complex cardiac history.  I have discussed with Dr. Oletta Darter, who will send in ICU team to come see her but states  it could be several hours.  She is more stable now but is at risk for cerebral edema.  Final Clinical Impressions(s) / ED Diagnoses     ED Discharge Orders    None       Sherwood Gambler, MD 04/25/17 [phone]

## 2017-04-24 NOTE — ED Notes (Signed)
Per Regenia Skeeter, MD pt placed on 2L [place].

## 2017-04-25 ENCOUNTER — Emergency Department (HOSPITAL_COMMUNITY): Payer: Medicare HMO

## 2017-04-25 ENCOUNTER — Encounter (HOSPITAL_COMMUNITY): Payer: Self-pay

## 2017-04-25 ENCOUNTER — Other Ambulatory Visit: Payer: Self-pay

## 2017-04-25 ENCOUNTER — Inpatient Hospital Stay (HOSPITAL_COMMUNITY): Payer: Medicare HMO

## 2017-04-25 DIAGNOSIS — I2510 Atherosclerotic heart disease of native coronary artery without angina pectoris: Secondary | ICD-10-CM | POA: Diagnosis present

## 2017-04-25 DIAGNOSIS — Z79899 Other long term (current) drug therapy: Secondary | ICD-10-CM | POA: Diagnosis not present

## 2017-04-25 DIAGNOSIS — I6389 Other cerebral infarction: Secondary | ICD-10-CM | POA: Diagnosis not present

## 2017-04-25 DIAGNOSIS — R51 Headache: Secondary | ICD-10-CM | POA: Diagnosis not present

## 2017-04-25 DIAGNOSIS — J90 Pleural effusion, not elsewhere classified: Secondary | ICD-10-CM | POA: Diagnosis not present

## 2017-04-25 DIAGNOSIS — Z8249 Family history of ischemic heart disease and other diseases of the circulatory system: Secondary | ICD-10-CM | POA: Diagnosis not present

## 2017-04-25 DIAGNOSIS — I161 Hypertensive emergency: Secondary | ICD-10-CM

## 2017-04-25 DIAGNOSIS — R29818 Other symptoms and signs involving the nervous system: Secondary | ICD-10-CM | POA: Diagnosis not present

## 2017-04-25 DIAGNOSIS — I619 Nontraumatic intracerebral hemorrhage, unspecified: Secondary | ICD-10-CM | POA: Diagnosis not present

## 2017-04-25 DIAGNOSIS — H53462 Homonymous bilateral field defects, left side: Secondary | ICD-10-CM | POA: Diagnosis present

## 2017-04-25 DIAGNOSIS — Z66 Do not resuscitate: Secondary | ICD-10-CM | POA: Diagnosis present

## 2017-04-25 DIAGNOSIS — Z7982 Long term (current) use of aspirin: Secondary | ICD-10-CM | POA: Diagnosis not present

## 2017-04-25 DIAGNOSIS — I63411 Cerebral infarction due to embolism of right middle cerebral artery: Secondary | ICD-10-CM | POA: Diagnosis not present

## 2017-04-25 DIAGNOSIS — Z515 Encounter for palliative care: Secondary | ICD-10-CM | POA: Diagnosis not present

## 2017-04-25 DIAGNOSIS — I5032 Chronic diastolic (congestive) heart failure: Secondary | ICD-10-CM | POA: Diagnosis not present

## 2017-04-25 DIAGNOSIS — R29810 Facial weakness: Secondary | ICD-10-CM | POA: Diagnosis present

## 2017-04-25 DIAGNOSIS — R918 Other nonspecific abnormal finding of lung field: Secondary | ICD-10-CM | POA: Diagnosis not present

## 2017-04-25 DIAGNOSIS — E7800 Pure hypercholesterolemia, unspecified: Secondary | ICD-10-CM | POA: Diagnosis present

## 2017-04-25 DIAGNOSIS — R29720 NIHSS score 20: Secondary | ICD-10-CM | POA: Diagnosis present

## 2017-04-25 DIAGNOSIS — I63311 Cerebral infarction due to thrombosis of right middle cerebral artery: Secondary | ICD-10-CM | POA: Diagnosis not present

## 2017-04-25 DIAGNOSIS — I4891 Unspecified atrial fibrillation: Secondary | ICD-10-CM | POA: Diagnosis not present

## 2017-04-25 DIAGNOSIS — R414 Neurologic neglect syndrome: Secondary | ICD-10-CM | POA: Diagnosis not present

## 2017-04-25 DIAGNOSIS — I63511 Cerebral infarction due to unspecified occlusion or stenosis of right middle cerebral artery: Secondary | ICD-10-CM | POA: Diagnosis not present

## 2017-04-25 DIAGNOSIS — I639 Cerebral infarction, unspecified: Secondary | ICD-10-CM | POA: Diagnosis not present

## 2017-04-25 DIAGNOSIS — I6300 Cerebral infarction due to thrombosis of unspecified precerebral artery: Secondary | ICD-10-CM | POA: Diagnosis not present

## 2017-04-25 DIAGNOSIS — J9611 Chronic respiratory failure with hypoxia: Secondary | ICD-10-CM | POA: Diagnosis not present

## 2017-04-25 DIAGNOSIS — Z90710 Acquired absence of both cervix and uterus: Secondary | ICD-10-CM | POA: Diagnosis not present

## 2017-04-25 DIAGNOSIS — Z794 Long term (current) use of insulin: Secondary | ICD-10-CM | POA: Diagnosis not present

## 2017-04-25 DIAGNOSIS — I169 Hypertensive crisis, unspecified: Secondary | ICD-10-CM | POA: Diagnosis not present

## 2017-04-25 DIAGNOSIS — E1151 Type 2 diabetes mellitus with diabetic peripheral angiopathy without gangrene: Secondary | ICD-10-CM | POA: Diagnosis present

## 2017-04-25 DIAGNOSIS — E669 Obesity, unspecified: Secondary | ICD-10-CM | POA: Diagnosis present

## 2017-04-25 DIAGNOSIS — I130 Hypertensive heart and chronic kidney disease with heart failure and stage 1 through stage 4 chronic kidney disease, or unspecified chronic kidney disease: Secondary | ICD-10-CM | POA: Diagnosis not present

## 2017-04-25 DIAGNOSIS — G8194 Hemiplegia, unspecified affecting left nondominant side: Secondary | ICD-10-CM | POA: Diagnosis not present

## 2017-04-25 DIAGNOSIS — G936 Cerebral edema: Secondary | ICD-10-CM | POA: Diagnosis not present

## 2017-04-25 DIAGNOSIS — E1142 Type 2 diabetes mellitus with diabetic polyneuropathy: Secondary | ICD-10-CM | POA: Diagnosis present

## 2017-04-25 LAB — CBC
HCT: 42.1 % (ref 36.0–46.0)
HEMOGLOBIN: 12.8 g/dL (ref 12.0–15.0)
MCH: 28.5 pg (ref 26.0–34.0)
MCHC: 30.4 g/dL (ref 30.0–36.0)
MCV: 93.8 fL (ref 78.0–100.0)
PLATELETS: 177 10*3/uL (ref 150–400)
RBC: 4.49 MIL/uL (ref 3.87–5.11)
RDW: 14.2 % (ref 11.5–15.5)
WBC: 7.6 10*3/uL (ref 4.0–10.5)

## 2017-04-25 LAB — I-STAT ARTERIAL BLOOD GAS, ED
Acid-Base Excess: 3 mmol/L — ABNORMAL HIGH (ref 0.0–2.0)
Bicarbonate: 28.2 mmol/L — ABNORMAL HIGH (ref 20.0–28.0)
O2 Saturation: 96 %
PH ART: 7.399 (ref 7.350–7.450)
TCO2: 30 mmol/L (ref 22–32)
pCO2 arterial: 45.6 mmHg (ref 32.0–48.0)
pO2, Arterial: 84 mmHg (ref 83.0–108.0)

## 2017-04-25 LAB — URINALYSIS, ROUTINE W REFLEX MICROSCOPIC
Bacteria, UA: NONE SEEN
Bilirubin Urine: NEGATIVE
Glucose, UA: NEGATIVE mg/dL
Ketones, ur: NEGATIVE mg/dL
Leukocytes, UA: NEGATIVE
Nitrite: NEGATIVE
PH: 6 (ref 5.0–8.0)
PROTEIN: 30 mg/dL — AB
Specific Gravity, Urine: 1.043 — ABNORMAL HIGH (ref 1.005–1.030)

## 2017-04-25 LAB — BRAIN NATRIURETIC PEPTIDE: B Natriuretic Peptide: 145.1 pg/mL — ABNORMAL HIGH (ref 0.0–100.0)

## 2017-04-25 LAB — CBG MONITORING, ED
GLUCOSE-CAPILLARY: 179 mg/dL — AB (ref 65–99)
GLUCOSE-CAPILLARY: 183 mg/dL — AB (ref 65–99)
Glucose-Capillary: 153 mg/dL — ABNORMAL HIGH (ref 65–99)

## 2017-04-25 LAB — HEMOGLOBIN A1C
Hgb A1c MFr Bld: 6.6 % — ABNORMAL HIGH (ref 4.8–5.6)
MEAN PLASMA GLUCOSE: 142.72 mg/dL

## 2017-04-25 LAB — GLUCOSE, CAPILLARY
Glucose-Capillary: 115 mg/dL — ABNORMAL HIGH (ref 65–99)
Glucose-Capillary: 147 mg/dL — ABNORMAL HIGH (ref 65–99)

## 2017-04-25 LAB — LIPID PANEL
CHOL/HDL RATIO: 6 ratio
CHOLESTEROL: 227 mg/dL — AB (ref 0–200)
HDL: 38 mg/dL — ABNORMAL LOW (ref >40)
LDL Cholesterol: 172 mg/dL — ABNORMAL HIGH (ref 0–99)
TRIGLYCERIDES: 84 mg/dL (ref <150)
VLDL: 17 mg/dL (ref 0–40)

## 2017-04-25 LAB — CREATININE, SERUM
CREATININE: 1.14 mg/dL — AB (ref 0.44–1.00)
GFR calc Af Amer: 48 mL/min — ABNORMAL LOW (ref >60)
GFR calc non Af Amer: 41 mL/min — ABNORMAL LOW (ref >60)

## 2017-04-25 LAB — RAPID URINE DRUG SCREEN, HOSP PERFORMED
Amphetamines: NOT DETECTED
Barbiturates: NOT DETECTED
Benzodiazepines: NOT DETECTED
Cocaine: NOT DETECTED
Opiates: NOT DETECTED
Tetrahydrocannabinol: NOT DETECTED

## 2017-04-25 LAB — MRSA PCR SCREENING: MRSA by PCR: NEGATIVE

## 2017-04-25 MED ORDER — INSULIN ASPART 100 UNIT/ML ~~LOC~~ SOLN
0.0000 [IU] | Freq: Three times a day (TID) | SUBCUTANEOUS | Status: DC
  Administered 2017-04-25 – 2017-04-26 (×3): 3 [IU] via SUBCUTANEOUS
  Administered 2017-04-26: 2 [IU] via SUBCUTANEOUS
  Administered 2017-04-26 – 2017-04-27 (×4): 3 [IU] via SUBCUTANEOUS
  Administered 2017-04-28: 5 [IU] via SUBCUTANEOUS
  Administered 2017-04-28: 8 [IU] via SUBCUTANEOUS
  Administered 2017-04-28: 3 [IU] via SUBCUTANEOUS
  Filled 2017-04-25 (×2): qty 1

## 2017-04-25 MED ORDER — ACETAMINOPHEN 160 MG/5ML PO SOLN
650.0000 mg | ORAL | Status: DC | PRN
  Administered 2017-04-28: 650 mg
  Filled 2017-04-25: qty 20.3

## 2017-04-25 MED ORDER — ONDANSETRON HCL 4 MG/2ML IJ SOLN
4.0000 mg | Freq: Once | INTRAMUSCULAR | Status: AC
  Administered 2017-04-24: 4 mg via INTRAVENOUS

## 2017-04-25 MED ORDER — ORAL CARE MOUTH RINSE
15.0000 mL | Freq: Two times a day (BID) | OROMUCOSAL | Status: DC
  Administered 2017-04-25 – 2017-04-26 (×4): 15 mL via OROMUCOSAL

## 2017-04-25 MED ORDER — ACETAMINOPHEN 325 MG PO TABS
650.0000 mg | ORAL_TABLET | ORAL | Status: DC | PRN
Start: 2017-04-25 — End: 2017-04-29

## 2017-04-25 MED ORDER — SENNOSIDES-DOCUSATE SODIUM 8.6-50 MG PO TABS: 1.0000 | ORAL_TABLET | Freq: Every evening | ORAL | Status: DC | PRN

## 2017-04-25 MED ORDER — STROKE: EARLY STAGES OF RECOVERY BOOK
Freq: Once | Status: DC
  Filled 2017-04-25 (×2): qty 1

## 2017-04-25 MED ORDER — ASPIRIN 300 MG RE SUPP
300.0000 mg | Freq: Every day | RECTAL | Status: DC
  Administered 2017-04-25: 300 mg via RECTAL
  Filled 2017-04-25: qty 1

## 2017-04-25 MED ORDER — SODIUM CHLORIDE 0.9 % IV SOLN
1.0000 g | Freq: Once | INTRAVENOUS | Status: AC
  Administered 2017-04-25: 1 g via INTRAVENOUS
  Filled 2017-04-25: qty 10

## 2017-04-25 MED ORDER — ACETAMINOPHEN 650 MG RE SUPP
650.0000 mg | RECTAL | Status: DC | PRN
  Administered 2017-04-25 – 2017-04-27 (×7): 650 mg via RECTAL
  Filled 2017-04-25 (×7): qty 1

## 2017-04-25 MED ORDER — HEPARIN SODIUM (PORCINE) 5000 UNIT/ML IJ SOLN
5000.0000 [IU] | Freq: Three times a day (TID) | INTRAMUSCULAR | Status: DC
  Administered 2017-04-25 (×2): 5000 [IU] via SUBCUTANEOUS
  Filled 2017-04-25 (×2): qty 1

## 2017-04-25 MED ORDER — SODIUM CHLORIDE 0.9 % IV SOLN
INTRAVENOUS | Status: DC
  Administered 2017-04-25 (×2): via INTRAVENOUS

## 2017-04-25 MED ORDER — ASPIRIN 325 MG PO TABS: 325.0000 mg | ORAL_TABLET | Freq: Every day | ORAL | Status: DC

## 2017-04-25 NOTE — ED Notes (Signed)
Pt CBG was 183, notified Arthur(RN)

## 2017-04-25 NOTE — ED Notes (Signed)
Karena Addison, Network engineer paged Neuro.

## 2017-04-25 NOTE — Consult Note (Signed)
Name: Pamela Chaney MRN: [id number] DOB: January 21, 1926    ADMISSION DATE:  05/05/2017 CONSULTATION DATE:  1/9  REFERRING MD : Dr. Regenia Skeeter  CHIEF COMPLAINT: Stroke  HISTORY OF PRESENT ILLNESS: 84 year old female with past medical history as below, which is significant for atrial fibrillation (she is no longer taking anticoagulation), remote colon cancer status post surgery and chemotherapy, coronary artery disease, and diabetes mellitus.  She presented to [facility] emergency department 1/8 after signaling her life alert and neighbors finding her with slurred speech and left facial droop.  Last known well at about 330 that afternoon.  She was profoundly hypertensive for EMS with a systolic blood pressure in the 250s.  She was treated as a code stroke upon arrival to the emergency department and a noncontrast CT of the head demonstrated hypodensity majority of the right MCA territory with some petechial bleeding.  CT angiogram and perfusion showed a right M1 occlusion.  She was not given TPA as she was considered to be outside the window and was a poor candidate for interventional radiology revascularization due to results of the CT perfusion study.  She is referred for ICU admission and PCCM was asked to see.  SIGNIFICANT EVENTS  1/8 present with a CVA admitted to ICU  STUDIES:  CT head 1/8> posterior right MCA territory acute infarction with areas of petechial hemorrhage measuring up to 6.5 mm CT perfusion 1/8> occlusion of the right middle cerebral artery at the M1/M2 junction with complete loss of opacification of the right M2 superior division.  Right MCA distribution infarction with ischemic penumbra volume measuring 51 mL's.  PAST MEDICAL HISTORY :   has a past medical history of Atrial fibrillation ([place]), Cancer ([place]), Coronary artery disease, Diabetes mellitus without complication ([place]), DVT (deep venous thrombosis) ([place]), High cholesterol, Hypertension, Peripheral neuropathy, and  Pulmonary hypertension ([place]).  has a past surgical history that includes Exploration post operative open heart and Abdominal hysterectomy. Prior to Admission medications   Medication Sig Start Date End Date Taking? Authorizing Provider  acetaminophen (TYLENOL) 500 MG tablet Take 1 tablet by mouth every 6 (six) hours as needed.    [provider]  aspirin EC 325 MG tablet Take 325 tablets by mouth daily.     [provider]  furosemide (LASIX) 40 MG tablet Take 1 tablet (40 mg total) by mouth daily. 08/12/16   Fritzi Mandes, MD  glipiZIDE (GLUCOTROL) 5 MG tablet Take 1 tablet by mouth 2 (two) times daily. 06/17/14   [provider]  HUMULIN 70/30 KWIKPEN (70-30) 100 UNIT/ML PEN Inject 20 Units into the skin 2 (two) times daily.  08/31/14   [provider]  metoprolol tartrate (LOPRESSOR) 25 MG tablet Take 12.5 mg by mouth 2 (two) times daily.  08/31/14   [provider]  PARoxetine (PAXIL) 20 MG tablet Take 20 mg by mouth daily.  08/31/14   [provider]  Simethicone (GAS RELIEF PO) Take 1 tablet by mouth daily as needed.  09/17/14   [provider]  traZODone (DESYREL) 50 MG tablet Take 25 mg by mouth daily. 09/15/15   [provider]   Allergies  Allergen Reactions  . Lisinopril Other (See Comments)    Causes weakness.  . Morphine And Related Other (See Comments)    Hallucinations    FAMILY HISTORY:  family history includes Colon cancer in her sister; Heart attack in her father; Pancreatic cancer in her mother. SOCIAL HISTORY:  reports that  has never smoked.  she has never used smokeless tobacco. She reports that she does not drink alcohol or use drugs.  REVIEW OF SYSTEMS:   Constitutional: Negative for fever, chills, weight loss, malaise/fatigue and diaphoresis.  HENT: Negative for hearing loss, ear pain, nosebleeds, congestion, sore throat, neck pain, tinnitus and ear discharge.   Eyes: Negative for blurred vision, double  vision, photophobia, pain, discharge and redness.  Respiratory: Negative for cough, hemoptysis, sputum production, shortness of breath, wheezing and stridor.   Cardiovascular: Negative for chest pain, palpitations, orthopnea, claudication, leg swelling and PND.  Gastrointestinal: Negative for heartburn, nausea, vomiting, abdominal pain, diarrhea, constipation, blood in stool and melena.  Genitourinary: Negative for dysuria, urgency, frequency, hematuria and flank pain.  Musculoskeletal: Negative for myalgias, back pain, joint pain and falls.  Skin: Negative for itching and rash.  Neurological: Negative for dizziness, tingling, tremors, sensory change, speech change, focal weakness, seizures, loss of consciousness, weakness and headaches.  Endo/Heme/Allergies: Negative for environmental allergies and polydipsia. Does not bruise/bleed easily.  SUBJECTIVE:   VITAL SIGNS: Pulse Rate:  [99-132] 101 (01/09 0100) Resp:  [19-22] 20 (01/09 0100) BP: (142-173)/(46-113) 149/86 (01/09 0100) SpO2:  [90 %-99 %] 90 % (01/09 0100) Weight:  [103.6 kg (228 lb 6.3 oz)] 103.6 kg (228 lb 6.3 oz) (01/08 2317)  PHYSICAL EXAMINATION: General: Obese elderly female in no acute distress Neuro: Alert, oriented, left-sided facial droop, left-sided weakness HEENT: Normocephalic, atraumatic, no JVD, PERRL Cardiovascular: Irregularly irregular, rate controlled, no murmurs rubs gallops Lungs: Clear bilateral breath sounds Abdomen: Soft, nontender, nondistended Musculoskeletal: No acute deformity Skin: Grossly intact  Recent Labs  Lab 04/30/2017 2217 05/10/2017 2227  NA 137 140  K 4.6 4.6  CL 104 105  CO2 22  --   BUN 29* 38*  CREATININE 1.15* 1.00  GLUCOSE 177* 177*   Recent Labs  Lab 05/15/2017 2217 04/28/2017 2227  HGB 13.0 14.6  HCT 43.3 43.0  WBC 7.3  --   PLT 177  --    Ct Angio Head W Or Wo Contrast  Result Date: 04/20/2017 EXAM: CT ANGIOGRAPHY HEAD AND NECK CT PERFUSION BRAIN TECHNIQUE:  Multidetector CT imaging of the head and neck was performed using the standard protocol during bolus administration of intravenous contrast. Multiplanar CT image reconstructions and MIPs were obtained to evaluate the vascular anatomy. Carotid stenosis measurements (when applicable) are obtained utilizing NASCET criteria, using the distal internal carotid diameter as the denominator. Multiphase CT imaging of the brain was performed following IV bolus contrast injection. Subsequent parametric perfusion maps were calculated using RAPID software. CONTRAST:  169mL ISOVUE-370 IOPAMIDOL (ISOVUE-370) INJECTION 76% COMPARISON:  Head CT 05/11/2017, 11/18/2014 FINDINGS: CTA NECK FINDINGS Aortic arch: There is mild calcific atherosclerosis of the aortic arch. There is no aneurysm, dissection or hemodynamically significant stenosis of the visualized ascending aorta and aortic arch. Conventional 3 vessel aortic branching pattern. There is atherosclerotic calcification within both proximal subclavian arteries without hemodynamically significant stenosis. Right carotid system: The right common carotid origin is widely patent. There is no common carotid or internal carotid artery dissection or aneurysm. There is atherosclerotic calcification of the carotid bifurcation without hemodynamically significant stenosis. Left carotid system: The left common carotid origin is widely patent. There is no common carotid or internal carotid artery dissection or aneurysm. There is atherosclerotic calcification at the carotid bifurcation without hemodynamically significant stenosis. Vertebral arteries: The vertebral system is left dominant. Both vertebral artery origins are patent. There is atherosclerotic calcification of the right vertebral origin. The right vertebral artery terminates  in the posterior inferior cerebellar artery. Skeleton: There is no bony spinal canal stenosis. No lytic or blastic lesions. Other neck: Patulous cervical  esophagus. No lymphadenopathy. Normal salivary glands. Normal pharynx and larynx. Heterogeneous thyroid gland. Upper chest: No pneumothorax or pleural effusion. No nodules or masses. Review of the MIP images confirms the above findings CTA HEAD FINDINGS Anterior circulation: --Intracranial internal carotid arteries: Atherosclerotic calcification bilaterally without hemodynamically significant stenosis. --Anterior cerebral arteries: Normal. --Middle cerebral arteries: The right middle cerebral artery is occluded at the M1/M 2 junction. There is persistent opacification of the inferior division and its distal branches. The left middle cerebral artery is normal. --Posterior communicating arteries: Present bilaterally. Posterior circulation: --Posterior cerebral arteries: Normal. --Superior cerebellar arteries: Normal. --Basilar artery: Normal. --Anterior inferior cerebellar arteries: Normal. --Posterior inferior cerebellar arteries: Normal. Venous sinuses: As permitted by contrast timing, patent. Anatomic variants: Bilateral posterior cerebral artery fetal origins. Right PICA arises from the termination of the right vertebral artery. Delayed phase: No parenchymal contrast enhancement. Petechial hemorrhage within the distribution of the infarction. Possible small focus of subarachnoid blood in the interhemispheric fissure. Review of the MIP images confirms the above findings. CT Brain Perfusion Findings: CBF (<30%) Volume: 28mL Perfusion (Tmax>6.0s) volume: 83mL Mismatch Volume: 90mL Infarction Location:Midportion of the right middle cerebral artery. The penumbra is primarily in the posterior zone of the middle cerebral artery distribution IMPRESSION: 1. Occlusion of the right middle cerebral artery at the M1/M2 junction with complete loss of opacification of the right M2 superior division. 2. Right middle cerebral artery distribution infarction with ischemic penumbra volume measuring 51 mL, primarily concentrated in  the posterior zone of the right MCA distribution. 3. Redemonstration of petechial hemorrhage in the distribution of the infarct, unchanged. Small focus of possible subarachnoid blood in the anterior interhemispheric fissure is also unchanged. 4. No hemodynamically significant stenosis of the carotid or vertebral arteries. 5.  Aortic Atherosclerosis (ICD10-I70.0). Critical Value/emergent results were called by telephone at the time of interpretation on 04/27/2017 at 11:53 pm to Dr. Amie Portland , who verbally acknowledged these results. Electronically Signed   By: Ulyses Jarred M.D.   On: 04/28/2017 23:54   Ct Angio Neck W Or Wo Contrast  Result Date: 05/14/2017 EXAM: CT ANGIOGRAPHY HEAD AND NECK CT PERFUSION BRAIN TECHNIQUE: Multidetector CT imaging of the head and neck was performed using the standard protocol during bolus administration of intravenous contrast. Multiplanar CT image reconstructions and MIPs were obtained to evaluate the vascular anatomy. Carotid stenosis measurements (when applicable) are obtained utilizing NASCET criteria, using the distal internal carotid diameter as the denominator. Multiphase CT imaging of the brain was performed following IV bolus contrast injection. Subsequent parametric perfusion maps were calculated using RAPID software. CONTRAST:  154mL ISOVUE-370 IOPAMIDOL (ISOVUE-370) INJECTION 76% COMPARISON:  Head CT 04/26/2017, 11/18/2014 FINDINGS: CTA NECK FINDINGS Aortic arch: There is mild calcific atherosclerosis of the aortic arch. There is no aneurysm, dissection or hemodynamically significant stenosis of the visualized ascending aorta and aortic arch. Conventional 3 vessel aortic branching pattern. There is atherosclerotic calcification within both proximal subclavian arteries without hemodynamically significant stenosis. Right carotid system: The right common carotid origin is widely patent. There is no common carotid or internal carotid artery dissection or aneurysm. There  is atherosclerotic calcification of the carotid bifurcation without hemodynamically significant stenosis. Left carotid system: The left common carotid origin is widely patent. There is no common carotid or internal carotid artery dissection or aneurysm. There is atherosclerotic calcification at the carotid bifurcation without hemodynamically  significant stenosis. Vertebral arteries: The vertebral system is left dominant. Both vertebral artery origins are patent. There is atherosclerotic calcification of the right vertebral origin. The right vertebral artery terminates in the posterior inferior cerebellar artery. Skeleton: There is no bony spinal canal stenosis. No lytic or blastic lesions. Other neck: Patulous cervical esophagus. No lymphadenopathy. Normal salivary glands. Normal pharynx and larynx. Heterogeneous thyroid gland. Upper chest: No pneumothorax or pleural effusion. No nodules or masses. Review of the MIP images confirms the above findings CTA HEAD FINDINGS Anterior circulation: --Intracranial internal carotid arteries: Atherosclerotic calcification bilaterally without hemodynamically significant stenosis. --Anterior cerebral arteries: Normal. --Middle cerebral arteries: The right middle cerebral artery is occluded at the M1/M 2 junction. There is persistent opacification of the inferior division and its distal branches. The left middle cerebral artery is normal. --Posterior communicating arteries: Present bilaterally. Posterior circulation: --Posterior cerebral arteries: Normal. --Superior cerebellar arteries: Normal. --Basilar artery: Normal. --Anterior inferior cerebellar arteries: Normal. --Posterior inferior cerebellar arteries: Normal. Venous sinuses: As permitted by contrast timing, patent. Anatomic variants: Bilateral posterior cerebral artery fetal origins. Right PICA arises from the termination of the right vertebral artery. Delayed phase: No parenchymal contrast enhancement. Petechial  hemorrhage within the distribution of the infarction. Possible small focus of subarachnoid blood in the interhemispheric fissure. Review of the MIP images confirms the above findings. CT Brain Perfusion Findings: CBF (<30%) Volume: 64mL Perfusion (Tmax>6.0s) volume: 96mL Mismatch Volume: 30mL Infarction Location:Midportion of the right middle cerebral artery. The penumbra is primarily in the posterior zone of the middle cerebral artery distribution IMPRESSION: 1. Occlusion of the right middle cerebral artery at the M1/M2 junction with complete loss of opacification of the right M2 superior division. 2. Right middle cerebral artery distribution infarction with ischemic penumbra volume measuring 51 mL, primarily concentrated in the posterior zone of the right MCA distribution. 3. Redemonstration of petechial hemorrhage in the distribution of the infarct, unchanged. Small focus of possible subarachnoid blood in the anterior interhemispheric fissure is also unchanged. 4. No hemodynamically significant stenosis of the carotid or vertebral arteries. 5.  Aortic Atherosclerosis (ICD10-I70.0). Critical Value/emergent results were called by telephone at the time of interpretation on 05/06/2017 at 11:53 pm to Dr. Amie Portland , who verbally acknowledged these results. Electronically Signed   By: Ulyses Jarred M.D.   On: 05/02/2017 23:54   Ct Cerebral Perfusion W Contrast  Result Date: 04/18/2017 EXAM: CT ANGIOGRAPHY HEAD AND NECK CT PERFUSION BRAIN TECHNIQUE: Multidetector CT imaging of the head and neck was performed using the standard protocol during bolus administration of intravenous contrast. Multiplanar CT image reconstructions and MIPs were obtained to evaluate the vascular anatomy. Carotid stenosis measurements (when applicable) are obtained utilizing NASCET criteria, using the distal internal carotid diameter as the denominator. Multiphase CT imaging of the brain was performed following IV bolus contrast injection.  Subsequent parametric perfusion maps were calculated using RAPID software. CONTRAST:  162mL ISOVUE-370 IOPAMIDOL (ISOVUE-370) INJECTION 76% COMPARISON:  Head CT 04/30/2017, 11/18/2014 FINDINGS: CTA NECK FINDINGS Aortic arch: There is mild calcific atherosclerosis of the aortic arch. There is no aneurysm, dissection or hemodynamically significant stenosis of the visualized ascending aorta and aortic arch. Conventional 3 vessel aortic branching pattern. There is atherosclerotic calcification within both proximal subclavian arteries without hemodynamically significant stenosis. Right carotid system: The right common carotid origin is widely patent. There is no common carotid or internal carotid artery dissection or aneurysm. There is atherosclerotic calcification of the carotid bifurcation without hemodynamically significant stenosis. Left carotid system: The left common  carotid origin is widely patent. There is no common carotid or internal carotid artery dissection or aneurysm. There is atherosclerotic calcification at the carotid bifurcation without hemodynamically significant stenosis. Vertebral arteries: The vertebral system is left dominant. Both vertebral artery origins are patent. There is atherosclerotic calcification of the right vertebral origin. The right vertebral artery terminates in the posterior inferior cerebellar artery. Skeleton: There is no bony spinal canal stenosis. No lytic or blastic lesions. Other neck: Patulous cervical esophagus. No lymphadenopathy. Normal salivary glands. Normal pharynx and larynx. Heterogeneous thyroid gland. Upper chest: No pneumothorax or pleural effusion. No nodules or masses. Review of the MIP images confirms the above findings CTA HEAD FINDINGS Anterior circulation: --Intracranial internal carotid arteries: Atherosclerotic calcification bilaterally without hemodynamically significant stenosis. --Anterior cerebral arteries: Normal. --Middle cerebral arteries: The right  middle cerebral artery is occluded at the M1/M 2 junction. There is persistent opacification of the inferior division and its distal branches. The left middle cerebral artery is normal. --Posterior communicating arteries: Present bilaterally. Posterior circulation: --Posterior cerebral arteries: Normal. --Superior cerebellar arteries: Normal. --Basilar artery: Normal. --Anterior inferior cerebellar arteries: Normal. --Posterior inferior cerebellar arteries: Normal. Venous sinuses: As permitted by contrast timing, patent. Anatomic variants: Bilateral posterior cerebral artery fetal origins. Right PICA arises from the termination of the right vertebral artery. Delayed phase: No parenchymal contrast enhancement. Petechial hemorrhage within the distribution of the infarction. Possible small focus of subarachnoid blood in the interhemispheric fissure. Review of the MIP images confirms the above findings. CT Brain Perfusion Findings: CBF (<30%) Volume: 83mL Perfusion (Tmax>6.0s) volume: 79mL Mismatch Volume: 93mL Infarction Location:Midportion of the right middle cerebral artery. The penumbra is primarily in the posterior zone of the middle cerebral artery distribution IMPRESSION: 1. Occlusion of the right middle cerebral artery at the M1/M2 junction with complete loss of opacification of the right M2 superior division. 2. Right middle cerebral artery distribution infarction with ischemic penumbra volume measuring 51 mL, primarily concentrated in the posterior zone of the right MCA distribution. 3. Redemonstration of petechial hemorrhage in the distribution of the infarct, unchanged. Small focus of possible subarachnoid blood in the anterior interhemispheric fissure is also unchanged. 4. No hemodynamically significant stenosis of the carotid or vertebral arteries. 5.  Aortic Atherosclerosis (ICD10-I70.0). Critical Value/emergent results were called by telephone at the time of interpretation on 04/23/2017 at 11:53 pm to Dr.  Amie [place] , who verbally acknowledged these results. Electronically Signed   By: Ulyses Jarred M.D.   On: 05/13/2017 23:54   Dg Chest Portable 1 View  Result Date: 04/25/2017 CLINICAL DATA:  Acute hypoxia EXAM: PORTABLE CHEST 1 VIEW COMPARISON:  Chest radiograph 08/10/2016 FINDINGS: Unchanged cardiomegaly with sequelae of coronary artery bypass grafting. Bilateral mild parahilar opacities. No focal consolidation. Small left pleural effusion. No pneumothorax. IMPRESSION: Mild pulmonary edema with small left pleural effusion. Unchanged cardiomegaly. Electronically Signed   By: Ulyses Jarred M.D.   On: 04/25/2017 01:03   Ct Head Code Stroke Wo Contrast  Result Date: 04/27/2017 CLINICAL DATA:  Code stroke. Right-sided facial droop. Other left-sided deficits. EXAM: CT HEAD WITHOUT CONTRAST TECHNIQUE: Contiguous axial images were obtained from the base of the skull through the vertex without intravenous contrast. COMPARISON:  Head CT 11/18/2014 FINDINGS: Brain: There is hypoattenuation throughout the posterior right MCA distribution. There are small areas of petechial hemorrhage measuring up to 6 mm. There is no space-occupying hematoma. A calcified meningioma along the right frontal convexity has slightly increased in size, now measuring 2.8 cm, previously 2.4 cm. No  significant mass effect on the underlying brain. Focal area of encephalomalacia in the right frontal lobe is unchanged. There is no midline shift or mass effect. There is an old left cerebellar infarct. Size and configuration of the ventricles are normal. Vascular: Symmetric vascular density. Skull: No calvarial or skull base abnormality. Sinuses/Orbits: No acute finding. Other: None. ASPECTS [facility] Stroke Program Early CT Score) - Ganglionic level infarction (caudate, lentiform nuclei, internal capsule, insula, M1-M3 cortex): 0 - Supraganglionic infarction (M4-M6 cortex): 3 Total score (0-10 with 10 being normal): 3 IMPRESSION: 1. Posterior right  MCA territory acute infarction with areas of petechial hemorrhage measuring up to 6.5 mm. No associated midline shift. 2. ASPECTS is 3. 3. Slight increase in size of partially calcified right frontal convexity meningioma without edema or other focal abnormality of the underlying brain. Critical Value/emergent results were called by telephone at the time of interpretation on 05/17/2017 at 10:57 pm to Dr. Rory Percy , who verbally acknowledged these results. Electronically Signed   By: Ulyses Jarred M.D.   On: 04/30/2017 23:01    ASSESSMENT / PLAN:  Acute right  MCA CVA:  Felt to be cardioembolic in the setting of atrial fibrillation without anticoagulation.  She did not receive TPA because she was outside the window, and was felt to be a poor candidate for an interventional radiology procedure. - Per neurology/stroke service - Permissive hypertension up to systolic blood pressure of 180 mmHg - MRI brain - Echo - ASA, atorvastatin  - Should her mental status decompensate she would want to be intubated. She would not want CPR should she arrest.  - Neurology feels significant risk for requiring hypertonic saline and potentially intubation.   Atrial fibrillation with RVR: rate now controlled after one dose IV metoprolol - Metoprolol PRN - Telemetry monitoring - Will defer anticoagulation to neurology as petechial hemorrhage noted on CT.  - Echo pending  Hypertensive Crisis: BP improved - PRN nicardipine, metoprolol - Permissive hypertension up to 156mmHg - Holding outpatient lasix, metoprolol  Will monitor in ICU  Georgann Housekeeper, AGACNP-BC [facility] Pulmonology/Critical Care Pager [phone] or [phone]  04/25/2017 1:48 AM

## 2017-04-25 NOTE — Progress Notes (Signed)
NEUROHOSPITALISTS STROKE TEAM - DAILY PROGRESS NOTE   ADMISSION HISTORY: Pamela Chaney is a 84 y.o. female past medical history of A. fib not on anticoagulant, diabetes, hypertension, hyperlipidemia, congestive heart failure, who is brought in as a code stroke from home [facility].  Initial report said last known normal 6 PM when she was noted to have left-sided facial droop, slurred speech and left-sided weakness.  Upon family arrival, they report that her last definitive seen normal was somewhere around 2 PM to 6 PM is when she was found to be weak on the left side.  She has a history of atrial fibrillation, not on anticoagulation as she discontinued her apixaban because of "wheezing problems with that medication".  She is currently not on anticoagulation.  She has had a stroke in the past based on imaging but she has never had any symptoms related to her stroke. She has a history of congestive heart failure, for which she follows up with cardiology. The [facility], systolic blood pressure in the 250s.  The report complete left-sided neglect, left hemiplegia and right gaze deviation along with the facial droop. Upon initial evaluation in the emergency room, was consistent with complete right MCA syndrome. Noncontrast CT of the head showed hypodensity in majority of the right MCA territory with some petechial bleeding as well along with a poor ASPECTS score of 3.  There was some delay in obtaining IV access and obtaining CTA and perfusion.  CT and perfusion showed a right M1 occlusion with a mismatch of 51 cc.  Case was discussed with neuro interventional specialist and based on the poor aspect score and reasonable core, decided to not be a candidate for intervention. She was past the window for IV TPA upon presentation. At baseline, walks with a cane, drives car, lives independently, performs ADLs  independently.  LKW: 2 PM on 04/25/2017 tpa given?: no, outside the window Premorbid modified Rankin scale (mRS): 2 NIHSS - 20  SUBJECTIVE (INTERVAL HISTORY)  Family is at the bedside. Patient is found laying in bed in NAD. Neuro exam is unchanged. Awaiting ICU bed, remains in ED. No new events reported overnight.   OBJECTIVE Lab Results: CBC:  Recent Labs  Lab 04/25/2017 2217 05/11/2017 2227 04/25/17 0142  WBC 7.3  --  7.6  HGB 13.0 14.6 12.8  HCT 43.3 43.0 42.1  MCV 94.5  --  93.8  PLT 177  --  177   BMP: Recent Labs  Lab 05/16/2017 2217 04/23/2017 2227 04/25/17 0142  NA 137 140  --   K 4.6 4.6  --   CL 104 105  --   CO2 22  --   --   GLUCOSE 177* 177*  --   BUN 29* 38*  --   CREATININE 1.15* 1.00 1.14*  CALCIUM 8.6*  --   --    PHYSICAL EXAM Temp:  [97.8 F (36.6 C)-98.5 F (36.9 C)] 98.5 F (36.9 C) (01/09 1500) Pulse Rate:  [69-133] 94 (01/09 1700) Resp:  [15-26] 22 (01/09 1700) BP: (125-173)/(46-121) 163/69 (01/09 1700) SpO2:  [90 %-100 %] 100 % (01/09 1700) Weight:  [99.6 kg (219  lb 9.3 oz)-103.6 kg (228 lb 6.3 oz)] 99.6 kg (219 lb 9.3 oz) (01/09 1500) General - Well nourished, well developed, in no apparent distress HEENT-  Normocephalic, Normal external eye/conjunctiva.  Normal external ears. Normal external nose, mucus membranes and septum.   Cardiovascular - Regular rate and rhythm  Respiratory - Lungs clear bilaterally. No wheezing. Abdomen - soft and non-tender, BS normal Extremities- no edema or cyanosis NEURO:  Mental Status: AA&Ox3  Language: speech is severely dysarthric.  Naming, repetition, fluency, and comprehension intact. Cranial Nerves: PERRL. EOM restricted with rightward gaze preference and inability to cross the midline to the left,, visual fields exam shows left homonymous hemianopsia, complete left lower facial weakness, decreased sensation on the left face, hearing intact, tongue/uvula/soft palate midline, normal sternocleidomastoid and  trapezius muscle strength. No evidence of tongue atrophy or fibrillations Motor: left dense hemiplegia 0/5 left upper, 0/5 left lower, 5/5 right upper, 5/5 right lower.  Normal tone normal range of motion in the right side, flaccid left upper and lower extremity. Sensation-does not into the left side of her body.  Completely lost sensation to noxious edematous on the left side of the body.  Unable to recognize her left hand when shown. Coordination: FTN intact on the right.  Unable to perform the left Gait- deferred  IMAGING: I have personally reviewed the radiological images below and agree with the radiology interpretations. Ct Angio Head and Neck W Or Wo Contrast and Perfusion Result Date: 04/17/2017  IMPRESSION: 1. Occlusion of the right middle cerebral artery at the M1/M2 junction with complete loss of opacification of the right M2 superior division. 2. Right middle cerebral artery distribution infarction with ischemic penumbra volume measuring 51 mL, primarily concentrated in the posterior zone of the right MCA distribution. 3. Redemonstration of petechial hemorrhage in the distribution of the infarct, unchanged. Small focus of possible subarachnoid blood in the anterior interhemispheric fissure is also unchanged. 4. No hemodynamically significant stenosis of the carotid or vertebral arteries. 5.  Aortic Atherosclerosis (ICD10-I70.0). Critical Value/emergent results were called by telephone at the time of interpretation on 05/17/2017 at 11:53 pm to Dr. Amie Portland , who verbally acknowledged these results. Electronically Signed   By: Ulyses Jarred M.D.   On: 04/23/2017 23:54   Mr Brain Wo Contrast Result Date: 04/25/2017 IMPRESSION: Large territory acute infarct right MCA territory with petechial hemorrhage. Local mass-effect and mild midline shift to the left. Electronically Signed   By: Franchot Gallo M.D.   On: 04/25/2017 08:27   Dg Chest Portable 1 View Result Date: 04/25/2017 IMPRESSION: Mild  pulmonary edema with small left pleural effusion. Unchanged cardiomegaly. Electronically Signed   By: Ulyses Jarred M.D.   On: 04/25/2017 01:03   Ct Head Code Stroke Wo Contrast Result Date: 05/13/2017 IMPRESSION: 1. Posterior right MCA territory acute infarction with areas of petechial hemorrhage measuring up to 6.5 mm. No associated midline shift. 2. ASPECTS is 3. 3. Slight increase in size of partially calcified right frontal convexity meningioma without edema or other focal abnormality of the underlying brain. Critical Value/emergent results were called by telephone at the time of interpretation on 04/28/2017 at 10:57 pm to Dr. Rory Percy , who verbally acknowledged these results. Electronically Signed   By: Ulyses Jarred M.D.   On: 04/25/2017 23:01   Echocardiogram:  PENDING Repeat CT Head                                               PENDING    IMPRESSION: Ms. CELSA NORDAHL is a 84 y.o. female with PMH of  A. fib not on anticoagulant, diabetes, hypertension, hyperlipidemia, congestive heart failure, presenting as acute code stroke with left facial droop, slurred speech and left-sided arm and leg flaccid weakness. Most likely a cardio embolic stroke in the setting of atrial fibrillation not on anticoagulation. Initially thought to be close to the 4-1/2-hour window since last seen normal but more history obtained from the family put her outside the window for the last known normal at 2 PM. Because of the poor aspects and a petechial bleed noticed on the noncontrast CT, not a candidate for acute intervention. Discussed findings in detail with the family, who understand the current clinical condition she is in and the rationale of therapy going forward. She also presented severely hypertensive, in A. fib with RVR, responded appropriately to metoprolol and is requiring O2 supplementation for maintaining sats. MRI reveals:  Large territory acute infarct right MCA  territory with petechial hemorrhage  Suspected Etiology: Likely cardio embolic from AFIB. Resultant Symptoms:  left facial droop, slurred speech and left-sided arm and leg flaccid weakness. Stroke Risk Factors: atrial fibrillation, hyperlipidemia and hypertension Other Stroke Risk Factors: Advanced age, Obesity, Body mass index is 33.39 kg/m. , CHF  Outstanding Stroke Work-up Studies:     Echocardiogram:                                                    Repeat Head CT  04/25/2017 ASSESSMENT:   Neuro exam - no worsening, unchanged with Left sided deficits and dysarthria. MRI this morning shows local mass-effect and mild midline shift to the left. No need for hypertonics at this time. Will repeat Head CT in AM. Need to discuss with family POC and goal of care, as patient is currently a partial code.  PLAN  04/25/2017: HOLD ASA neuroimaging is stable & without evidence of bleeding Repeat Head CT in AM Frequent neuro checks Telemetry monitoring PT/OT/SLP Consult PM & Rehab Consult Case Management /MSW Ongoing aggressive stroke risk factor management Patient will be counseled to be compliant with her antithrombotic medications Patient will be counseled on Lifestyle modifications including, Diet, Exercise, and Stress Follow up with [facility] in 6 weeks  DYSPHAGIA: NPO until passes SLP swallow evaluation Aspiration Precautions in progress  CEREBRAL EDEMA: If her mental status were to deteriorate, decision on hypertonic's can be made at that time.  MEDICAL ISSUES: Appreciate PCCM consult for assistance with medical management while in the ICU  AFIB with RVR EKG shows A. Fib Continue Metoprolol for Rate control as needed Elevated Troponin's and BNP Cardiology consultation in AM Continue to HOLD [facility] - Will need long-term anticoagulation -decision on timing of starting to correlation based on clinical course and stroke size on MRI.   RESP Watch respiratory status  closely for decompensation. Patient is requiring O2 supplementation for maintaining sats.  CKD Gentle IV hydration Repeat Labs in AM  Congestive heart failure Mild Pulmonary edema on admission CXR TTE pending.  May consider cards consult after test results.  HYPERTENSION: Stable Permissive hypertension - SBP greater than 180 for 24-48 hours post stroke and then gradually  Will start patient on Nicardipine drip, Labetolol PRN Long term BP goal normotensive. May slowly restart home B/P medications after 48 hours Home Meds: Metoprolol  HYPERLIPIDEMIA:    Component Value Date/Time   CHOL 227 (H) 04/25/2017 0353   TRIG 84 04/25/2017 0353   HDL 38 (L) 04/25/2017 0353   CHOLHDL 6.0 04/25/2017 0353   VLDL 17 04/25/2017 0353   LDLCALC 172 (H) 04/25/2017 0353  Home Meds:  NONE LDL  goal < 70 Will start on Lipitor to 80 mg daily, once medically stable Will continue statin at discharge  DIABETES: Lab Results  Component Value Date   HGBA1C 6.6 (H) 04/25/2017  HgbA1c goal < 7.0 Currently on: Novolog Continue CBG monitoring and SSI to maintain glucose 140-180 mg/dl DM education   OBESITY Obesity, Body mass index is 33.39 kg/m. Greater than/equal to 30  Other Active Problems: Active Problems:   Stroke (cerebrum) [facility])    Hospital day # 0 VTE prophylaxis: SCD's  Diet : Diet NPO time specified   FAMILY UPDATES: family at bedside  TEAM UPDATES: Garvin Fila, MD STATUS:  She is a DNR at this time but not a DNI.   Prior Home Stroke Medications:  None - per notes she stopped taking AC without medical advice  Discharge Stroke Meds:  Please discharge patient on TBD   Disposition: 06-Home-Health Care Svc Therapy Recs:               PENDING Home Equipment:         PENDING Follow Up:  Follow-up Information    Garvin Fila, MD. Schedule an appointment as soon as possible for a visit in 6 week(s).   Specialties:  Neurology, Radiology Contact information: [address] [place] [address] [phone]          Idelle Crouch, MD -PCP Follow up in 1-2 weeks     Assessment & plan discussed with with attending physician and they are in agreement.    Mary Sella, ANP-C Stroke Neurology Team 04/25/2017 5:36 PM I have personally examined this patient, reviewed notes, independently viewed imaging studies, participated in medical decision making and plan of care.ROS completed by me personally and pertinent positives fully documented  I have made any additions or clarifications directly to the above note. Agree with note above.  She has presented with a large right middle cerebral artery infarct likely from atrial fibrillation and is at risk for neurological worsening with development of cerebral edema.  Family has agreed to limited code but is not available at the bedside for discussion at the present time.  Recommend admit to ICU and repeat CT scan tomorrow and discuss with family goals of care. This patient is critically ill and at significant risk of neurological worsening, death and care requires constant monitoring of vital signs, hemodynamics,respiratory and cardiac monitoring, extensive review of multiple databases, frequent neurological assessment, discussion with family, other specialists and medical decision making of high complexity.I have made any additions or clarifications directly to the above note.This critical care time does not reflect procedure time, or teaching time or supervisory time of PA/NP/Med Resident etc but could involve care discussion time.  I spent 30 minutes of neurocritical care time  in the care of  this patient.      Antony Contras, MD Medical Director  [facility] Pager: [phone] 04/25/2017 6:38 PM  To contact Stroke Continuity provider, please refer to [email or web address] After hours, contact General Neurology

## 2017-04-25 NOTE — H&P (Addendum)
CC: LSW  History is obtained from: Chart, patient, family  HPI: Pamela Chaney is a 84 y.o. female past medical history of A. fib not on anticoagulant, diabetes, hypertension, hyperlipidemia, congestive heart failure, who is brought in as a code stroke from home [facility].  Initial report said last known normal 6 PM when she was noted to have left-sided facial droop, slurred speech and left-sided weakness.  Upon family arrival, they report that her last definitive seen normal was somewhere around 2 PM to 6 PM is when she was found to be weak on the left side.  She has a history of atrial fibrillation, not on anticoagulation as she discontinued her apixaban because of "wheezing problems with that medication".  She is currently not on anticoagulation.  She has had a stroke in the past based on imaging but she has never had any symptoms related to her stroke. She has a history of congestive heart failure, for which she follows up with cardiology. [facility], systolic blood pressure in the 250s.  The report complete left-sided neglect, left hemiplegia and right gaze deviation along with the facial droop. Upon initial evaluation in the emergency room, was consistent with complete right MCA syndrome. Noncontrast CT of the head showed hypodensity in majority of the right MCA territory with some petechial bleeding as well along with a poor ASPECTS score of 3.  There was some delay in obtaining IV access and obtaining CTA and perfusion.  CT and perfusion showed a right M1 occlusion with a mismatch of 51 cc.  Case was discussed with neuro interventional specialist and based on the poor aspect score and reasonable core, decided to not be a candidate for intervention. She was past the window for IV TPA upon presentation. At baseline, walks with a cane, drives car, lives independently, performs ADLs independently.  LKW: 2 PM on 04/25/2017 tpa given?: no, outside the  window Premorbid modified Rankin scale (mRS): 2  ROS: Review of Systems  Constitutional: Negative for chills and fever.  HENT: Negative for hearing loss and tinnitus.   Eyes: Negative for blurred vision and double vision.  Respiratory: Negative for cough and hemoptysis.   Cardiovascular: Negative for chest pain and palpitations.  Gastrointestinal: Negative for heartburn and nausea.  Genitourinary: Negative for dysuria and urgency.  Musculoskeletal: Negative for myalgias and neck pain.  Skin: Negative for itching and rash.  Neurological: Positive for sensory change, speech change and focal weakness. Negative for dizziness, tingling, tremors, seizures, loss of consciousness and headaches.  Endo/Heme/Allergies: Negative for environmental allergies. Does not bruise/bleed easily.  Psychiatric/Behavioral: Negative for depression and suicidal ideas.        Past Medical History:  Diagnosis Date  . Atrial fibrillation ([place])   . Cancer ([place])    colon  . Coronary artery disease   . Diabetes mellitus without complication ([place])   . High cholesterol   . Hypertension   . Peripheral neuropathy   . Pulmonary hypertension ([facility])           Family History  Problem Relation Age of Onset  . Pancreatic cancer Mother   . Heart attack Father   . Colon cancer Sister     Social History:   reports that  has never smoked. she has never used smokeless tobacco. She reports that she does not drink alcohol or use drugs.  Medications  Current Facility-Administered Medications:  .  nicardipine (CARDENE) 20mg  in 0.86% saline 244ml IV  infusion (0.1 mg/ml), 3-15 mg/hr, Intravenous, Continuous, Regenia Skeeter, Scott, MD .  niCARdipine in saline (CARDENE-IV) 20-0.86 MG/200ML-% infusion SOLN, , , ,   Current Outpatient Medications:  .  acetaminophen (TYLENOL) 500 MG tablet, Take 1 tablet by mouth every 6 (six) hours as needed., Disp: , Rfl:  .  aspirin EC 325 MG tablet, Take 325 tablets by  mouth daily. , Disp: , Rfl:  .  furosemide (LASIX) 40 MG tablet, Take 1 tablet (40 mg total) by mouth daily., Disp: 30 tablet, Rfl: 0 .  glipiZIDE (GLUCOTROL) 5 MG tablet, Take 1 tablet by mouth 2 (two) times daily., Disp: , Rfl:  .  HUMULIN 70/30 KWIKPEN (70-30) 100 UNIT/ML PEN, Inject 20 Units into the skin 2 (two) times daily. , Disp: , Rfl:  .  metoprolol tartrate (LOPRESSOR) 25 MG tablet, Take 12.5 mg by mouth 2 (two) times daily. , Disp: , Rfl:  .  PARoxetine (PAXIL) 20 MG tablet, Take 20 mg by mouth daily. , Disp: , Rfl:  .  Simethicone (GAS RELIEF PO), Take 1 tablet by mouth daily as needed. , Disp: , Rfl:  .  traZODone (DESYREL) 50 MG tablet, Take 25 mg by mouth daily., Disp: , Rfl:    Exam: Current vital signs: BP (!) 152/46   Pulse (!) 107   Resp (!) 22   SpO2 93%  Vital signs in last 24 hours: Pulse Rate:  [100-132] 107 (01/08 2345) Resp:  [21-22] 22 (01/08 2345) BP: (152-173)/(46-112) 152/46 (01/08 2345) SpO2:  [93 %-99 %] 93 % (01/08 2345)  GENERAL: Awake, alert in NAD HEENT: - Normocephalic and atraumatic, dry mm, no LN++, no Thyromegally LUNGS - Clear to auscultation bilaterally with no wheezes CV - S1S2 RRR, no m/r/g, equal pulses bilaterally. ABDOMEN - Soft, nontender, nondistended with normoactive BS Ext: Cold bilateral feet, unable to palpate pulses, 1+ edema  NEURO:  Mental Status: AA&Ox3  Language: speech is severely dysarthric.  Naming, repetition, fluency, and comprehension intact. Cranial Nerves: PERRL. EOM restricted with rightward gaze preference and inability to cross the midline to the left,, visual fields exam shows left homonymous hemianopsia, complete left lower facial weakness, decreased sensation on the left face, hearing intact, tongue/uvula/soft palate midline, normal sternocleidomastoid and trapezius muscle strength. No evidence of tongue atrophy or fibrillations Motor: 0/5 left upper, 0/5 left lower, 5/5 right upper, 5/5 right lower.  Normal  tone normal range of motion in the right side, flaccid left upper and lower extremity. Sensation-does not into the left side of her body.  Completely lost sensation to noxious edematous on the left side of the body.  Unable to recognize her left hand when shown. Coordination: FTN intact on the right.  Unable to perform the left Gait- deferred  NIHSS - 20   Labs I have reviewed labs in epic and the results pertinent to this consultation are:  CBC Labs(Brief)          Component Value Date/Time   WBC 7.3 05/14/2017 2217   RBC 4.58 04/20/2017 2217   HGB 14.6 05/06/2017 2227   HGB 12.8 10/05/2011 0922   HCT 43.0 05/13/2017 2227   HCT 40.2 10/05/2011 0922   PLT 177 05/16/2017 2217   PLT 215 10/05/2011 0922   MCV 94.5 05/06/2017 2217   MCV 92 10/05/2011 0922   MCH 28.4 05/15/2017 2217   MCHC 30.0 04/23/2017 2217   RDW 14.2 04/20/2017 2217   RDW 14.6 (H) 10/05/2011 0922   LYMPHSABS 2.0 04/25/2017 2217  LYMPHSABS 2.0 10/05/2011 0922   MONOABS 0.4 04/21/2017 2217   MONOABS 0.4 10/05/2011 0922   EOSABS 0.0 05/17/2017 2217   EOSABS 0.1 10/05/2011 0922   BASOSABS 0.0 04/20/2017 2217   BASOSABS 0.0 10/05/2011 0922      CMP     Labs(Brief)          Component Value Date/Time   NA 140 04/21/2017 2227   NA 138 10/05/2011 0922   K 4.6 05/16/2017 2227   K 4.4 10/05/2011 0922   CL 105 05/11/2017 2227   CL 104 10/05/2011 0922   CO2 22 05/04/2017 2217   CO2 24 10/05/2011 0922   GLUCOSE 177 (H) 04/28/2017 2227   GLUCOSE 239 (H) 10/05/2011 0922   BUN 38 (H) 05/14/2017 2227   BUN 16 10/05/2011 0922   CREATININE 1.00 05/13/2017 2227   CREATININE 1.09 10/05/2011 0922   CALCIUM 8.6 (L) 05/03/2017 2217   CALCIUM 9.0 10/05/2011 0922   PROT 7.1 05/08/2017 2217   PROT 7.6 10/05/2011 0922   ALBUMIN 3.6 04/26/2017 2217   ALBUMIN 3.5 10/05/2011 0922   AST 30 05/09/2017 2217   AST 26 10/05/2011 0922   ALT 15 05/04/2017 2217   ALT  30 10/05/2011 0922   ALKPHOS 88 05/17/2017 2217   ALKPHOS 113 10/05/2011 0922   BILITOT 1.1 04/30/2017 2217   BILITOT 0.3 10/05/2011 0922   GFRNONAA 41 (L) 04/27/2017 2217   GFRNONAA 47 (L) 10/05/2011 0922   GFRAA 47 (L) 04/19/2017 2217   GFRAA 54 (L) 10/05/2011 9767      Imaging I have reviewed the images obtained:  CT-scan of the brain -hypodensity with loss of gray-white differentiation in the right MCA territory posterior division territory.  Petechial bleed also noticed.  ASPECTS score of 3.  No hydrocephalus. CT angiogram and perfusion shows right M1 cutoff.  Perfusion shows CBF less than 30% volume of 40 cc, T-max greater than 6 seconds of 91-mismatch 51 cc.  Assessment:  84 year old presenting as acute code stroke with left facial droop, slurred speech and left-sided arm and leg flaccid weakness. Most likely a cardio embolic stroke in the setting of atrial fibrillation not on anticoagulation. Initially thought to be close to the 4-1/2-hour window since last seen normal but more history obtained from the family put her outside the window for the last known normal at 2 PM. Because of the poor aspects and a petechial bleed noticed on the noncontrast CT, not a candidate for acute intervention. Discussed findings in detail with the family, who understand the current clinical condition she is in and the rationale of therapy going forward.  She also presented severely hypertensive, in A. fib with RVR, responded appropriately to metoprolol and is requiring O2 supplementation for maintaining sats.  Impression: Acute ischemic stroke-right MCA territory.  Likely cardio embolic. Atrial fibrillation with RVR Hypertensive urgency Congestive heart failure Peripheral vascular disease Eval for resp failure DM  Recommendations: At this time, I would recommend ICU admission or at least a stepdown admission for close neuro monitoring, management of atrial fibrillation with  RVR/hypertensive urgency/CHF/respiratory status.  Pulmonary critical care medicine was consulted and they feel that they would be available as consultants but do not need to admit the patient onto their service. Neurology service will admit the patient to the stroke service at this time. She needs stroke risk factor workup as below. At this point I do not think she needs hypertonics.  If her mental status were to deteriorate, decision on hypertonic's can  be made at that time. I had a detailed discussion with the family.  She is a DNR at this time but not a DNI.  DM - SSI  HTN - cardene for SBP >180.  CHF - c/w BB. Obtain TTE. Might need cards consult after test results.  Watch respiratory status closely for decompensation.  Afib with RVR - metoprolol PRN.   Stroke risk factor workup as below: -Telemetry monitoring -Allow for permissive hypertension for the first 24-48h - only treat PRN if SBP >180 mmHg. Blood pressures can be gradually normalized to SBP<140 upon discharge. -MRI brain without contrast -Echocardiogram -HgbA1c, fasting lipid panel -Frequent neuro checks -Prophylactic therapy-Antiplatelet med: Aspirin - dose 325mg  PO or 300mg  PR -Atorvastatin 80 mg PO daily -Risk factor modification -PT consult, OT consult, Speech consult -Will need long-term anticoagulation -decision on timing of starting to correlation based on clinical course and stroke size on MRI.   Appreciate PCCM consult for assistance with medical management while in the ICU.  Please [place] stroke NP/PA/MD (listed on AMION)  from 8am-4 pm as this patient will be followed by the stroke team at this point.  -- Amie Portland, MD [facility] Pager: [phone] If 7pm to 7am, please call on call as listed on AMION.  CRITICAL CARE ATTESTATION This patient is critically ill and at significant risk of neurological worsening, death and care requires constant monitoring of vital signs,  hemodynamics,respiratory and cardiac monitoring. I spent 65  minutes of neurocritical care time performing neurological assessment, discussion with family, other specialists and medical decision making of high complexityin the care of  this patient.

## 2017-04-25 NOTE — ED Triage Notes (Signed)
Per Dr. Vilinda Blanks, RN and Maudie Mercury, [facility] did not provide report and was aggressive. This RN was discharging another pt at time of arrival. Family reports they last saw pt at 2:30 pm. Pt reports talking on the phone to a family member at 3 pm. Pt reports at unknown time she was trying to get out of her chair. Pt reports she wears a medic alert bracelet on her left arm and she had to work to press the button to call EMS. Family reports NKA. Family reports hx of CHF, HTN, DM, neuropathy, DVT, and colon cancer.

## 2017-04-25 NOTE — ED Notes (Signed)
Leonel Ramsay, MD responded to [place]. Kirkpatrick aware of headache and administration of tylenol suppository. Leonel Ramsay will place orders after MRI results.

## 2017-04-25 NOTE — ED Notes (Signed)
Puja, RN gave 4mg  of zofran.

## 2017-04-25 NOTE — ED Notes (Signed)
Patient transported to MRI 

## 2017-04-25 NOTE — Consult Note (Deleted)
Neurology Consultation  Reason for Consult: code stroke Referring Physician: Dr. Regenia Skeeter  CC: LSW  History is obtained from: Chart, patient, family  HPI: Pamela Chaney is a 84 y.o. female past medical history of A. fib not on anticoagulant, diabetes, hypertension, hyperlipidemia, congestive heart failure, who is brought in as a code stroke from home [facility].  Initial report said last known normal 6 PM when she was noted to have left-sided facial droop, slurred speech and left-sided weakness.  Upon family arrival, they report that her last definitive seen normal was somewhere around 2 PM to 6 PM is when she was found to be weak on the left side.  She has a history of atrial fibrillation, not on anticoagulation as she discontinued her apixaban because of "wheezing problems with that medication".  She is currently not on anticoagulation.  She has had a stroke in the past based on imaging but she has never had any symptoms related to her stroke. She has a history of congestive heart failure, for which she follows up with cardiology. [facility], systolic blood pressure in the 250s.  The report complete left-sided neglect, left hemiplegia and right gaze deviation along with the facial droop. Upon initial evaluation in the emergency room, was consistent with complete right MCA syndrome. Noncontrast CT of the head showed hypodensity in majority of the right MCA territory with some petechial bleeding as well along with a poor ASPECTS score of 3.  There was some delay in obtaining IV access and obtaining CTA and perfusion.  CT and perfusion showed a right M1 occlusion with a mismatch of 51 cc.  Case was discussed with neuro interventional specialist and based on the poor aspect score and reasonable core, decided to not be a candidate for intervention. She was past the window for IV TPA upon presentation. At baseline, walks with a cane, drives car, lives  independently, performs ADLs independently.  LKW: 2 PM on 04/25/2017 tpa given?: no, outside the window Premorbid modified Rankin scale (mRS): 2  ROS: Review of Systems  Constitutional: Negative for chills and fever.  HENT: Negative for hearing loss and tinnitus.   Eyes: Negative for blurred vision and double vision.  Respiratory: Negative for cough and hemoptysis.   Cardiovascular: Negative for chest pain and palpitations.  Gastrointestinal: Negative for heartburn and nausea.  Genitourinary: Negative for dysuria and urgency.  Musculoskeletal: Negative for myalgias and neck pain.  Skin: Negative for itching and rash.  Neurological: Positive for sensory change, speech change and focal weakness. Negative for dizziness, tingling, tremors, seizures, loss of consciousness and headaches.  Endo/Heme/Allergies: Negative for environmental allergies. Does not bruise/bleed easily.  Psychiatric/Behavioral: Negative for depression and suicidal ideas.    Past Medical History:  Diagnosis Date  . Atrial fibrillation ([place])   . Cancer ([place])    colon  . Coronary artery disease   . Diabetes mellitus without complication ([place])   . High cholesterol   . Hypertension   . Peripheral neuropathy   . Pulmonary hypertension ([facility])      Family History  Problem Relation Age of Onset  . Pancreatic cancer Mother   . Heart attack Father   . Colon cancer Sister     Social History:   reports that  has never smoked. she has never used smokeless tobacco. She reports that she does not drink alcohol or use drugs.  Medications  Current Facility-Administered Medications:  .  nicardipine (CARDENE) 20mg  in  0.86% saline 231ml IV infusion (0.1 mg/ml), 3-15 mg/hr, Intravenous, Continuous, Regenia Skeeter, Scott, MD .  niCARdipine in saline (CARDENE-IV) 20-0.86 MG/200ML-% infusion SOLN, , , ,   Current Outpatient Medications:  .  acetaminophen (TYLENOL) 500 MG tablet, Take 1 tablet by mouth every 6 (six) hours as needed.,  Disp: , Rfl:  .  aspirin EC 325 MG tablet, Take 325 tablets by mouth daily. , Disp: , Rfl:  .  furosemide (LASIX) 40 MG tablet, Take 1 tablet (40 mg total) by mouth daily., Disp: 30 tablet, Rfl: 0 .  glipiZIDE (GLUCOTROL) 5 MG tablet, Take 1 tablet by mouth 2 (two) times daily., Disp: , Rfl:  .  HUMULIN 70/30 KWIKPEN (70-30) 100 UNIT/ML PEN, Inject 20 Units into the skin 2 (two) times daily. , Disp: , Rfl:  .  metoprolol tartrate (LOPRESSOR) 25 MG tablet, Take 12.5 mg by mouth 2 (two) times daily. , Disp: , Rfl:  .  PARoxetine (PAXIL) 20 MG tablet, Take 20 mg by mouth daily. , Disp: , Rfl:  .  Simethicone (GAS RELIEF PO), Take 1 tablet by mouth daily as needed. , Disp: , Rfl:  .  traZODone (DESYREL) 50 MG tablet, Take 25 mg by mouth daily., Disp: , Rfl:    Exam: Current vital signs: BP (!) 152/46   Pulse (!) 107   Resp (!) 22   SpO2 93%  Vital signs in last 24 hours: Pulse Rate:  [100-132] 107 (01/08 2345) Resp:  [21-22] 22 (01/08 2345) BP: (152-173)/(46-112) 152/46 (01/08 2345) SpO2:  [93 %-99 %] 93 % (01/08 2345)  GENERAL: Awake, alert in NAD HEENT: - Normocephalic and atraumatic, dry mm, no LN++, no Thyromegally LUNGS - Clear to auscultation bilaterally with no wheezes CV - S1S2 RRR, no m/r/g, equal pulses bilaterally. ABDOMEN - Soft, nontender, nondistended with normoactive BS Ext: Cold bilateral feet, unable to palpate pulses, 1+ edema  NEURO:  Mental Status: AA&Ox3  Language: speech is severely dysarthric.  Naming, repetition, fluency, and comprehension intact. Cranial Nerves: PERRL. EOM restricted with rightward gaze preference and inability to cross the midline to the left,, visual fields exam shows left homonymous hemianopsia, complete left lower facial weakness, decreased sensation on the left face, hearing intact, tongue/uvula/soft palate midline, normal sternocleidomastoid and trapezius muscle strength. No evidence of tongue atrophy or fibrillations Motor: 0/5 left  upper, 0/5 left lower, 5/5 right upper, 5/5 right lower.  Normal tone normal range of motion in the right side, flaccid left upper and lower extremity. Sensation-does not into the left side of her body.  Completely lost sensation to noxious edematous on the left side of the body.  Unable to recognize her left hand when shown. Coordination: FTN intact on the right.  Unable to perform the left Gait- deferred  NIHSS - 20   Labs I have reviewed labs in epic and the results pertinent to this consultation are:  CBC    Component Value Date/Time   WBC 7.3 05/05/2017 2217   RBC 4.58 04/17/2017 2217   HGB 14.6 05/13/2017 2227   HGB 12.8 10/05/2011 0922   HCT 43.0 05/05/2017 2227   HCT 40.2 10/05/2011 0922   PLT 177 05/08/2017 2217   PLT 215 10/05/2011 0922   MCV 94.5 05/08/2017 2217   MCV 92 10/05/2011 0922   MCH 28.4 05/05/2017 2217   MCHC 30.0 05/13/2017 2217   RDW 14.2 04/27/2017 2217   RDW 14.6 (H) 10/05/2011 0922   LYMPHSABS 2.0 05/04/2017 2217   LYMPHSABS 2.0 10/05/2011  0922   MONOABS 0.4 05/05/2017 2217   MONOABS 0.4 10/05/2011 0922   EOSABS 0.0 05/03/2017 2217   EOSABS 0.1 10/05/2011 0922   BASOSABS 0.0 04/17/2017 2217   BASOSABS 0.0 10/05/2011 0922    CMP     Component Value Date/Time   NA 140 05/13/2017 2227   NA 138 10/05/2011 0922   K 4.6 05/15/2017 2227   K 4.4 10/05/2011 0922   CL 105 05/07/2017 2227   CL 104 10/05/2011 0922   CO2 22 04/25/2017 2217   CO2 24 10/05/2011 0922   GLUCOSE 177 (H) 04/19/2017 2227   GLUCOSE 239 (H) 10/05/2011 0922   BUN 38 (H) 05/14/2017 2227   BUN 16 10/05/2011 0922   CREATININE 1.00 04/28/2017 2227   CREATININE 1.09 10/05/2011 0922   CALCIUM 8.6 (L) 04/26/2017 2217   CALCIUM 9.0 10/05/2011 0922   PROT 7.1 05/04/2017 2217   PROT 7.6 10/05/2011 0922   ALBUMIN 3.6 05/16/2017 2217   ALBUMIN 3.5 10/05/2011 0922   AST 30 04/26/2017 2217   AST 26 10/05/2011 0922   ALT 15 04/17/2017 2217   ALT 30 10/05/2011 0922   ALKPHOS 88  05/10/2017 2217   ALKPHOS 113 10/05/2011 0922   BILITOT 1.1 05/09/2017 2217   BILITOT 0.3 10/05/2011 0922   GFRNONAA 41 (L) 05/13/2017 2217   GFRNONAA 47 (L) 10/05/2011 0922   GFRAA 47 (L) 04/23/2017 2217   GFRAA 54 (L) 10/05/2011 1610    Imaging I have reviewed the images obtained:  CT-scan of the brain -hypodensity with loss of gray-white differentiation in the right MCA territory posterior division territory.  Petechial bleed also noticed.  ASPECTS score of 3.  No hydrocephalus. CT angiogram and perfusion shows right M1 cutoff.  Perfusion shows CBF less than 30% volume of 40 cc, T-max greater than 6 seconds of 91-mismatch 51 cc.  Assessment:  84 year old presenting as acute code stroke with left facial droop, slurred speech and left-sided arm and leg flaccid weakness. Most likely a cardio embolic stroke in the setting of atrial fibrillation not on anticoagulation. Initially thought to be close to the 4-1/2-hour window since last seen normal but more history obtained from the family put her outside the window for the last known normal at 2 PM. Because of the poor aspects and a petechial bleed noticed on the noncontrast CT, not a candidate for acute intervention. Discussed findings in detail with the family, who understand the current clinical condition she is in and the rationale of therapy going forward.  She also presented severely hypertensive, in A. fib with RVR, responded appropriately to metoprolol and is requiring O2 supplementation for maintaining sats.  Impression: Acute ischemic stroke-right MCA territory.  Likely cardio embolic. Atrial fibrillation with RVR Hypertensive urgency Congestive heart failure Peripheral vascular disease Eval for resp failure  Recommendations: At this time, I would recommend ICU admission or at least a stepdown admission for close neuro monitoring, management of atrial fibrillation with RVR/hypertensive urgency/CHF/respiratory status. She  needs stroke risk factor workup as below. At this point I do not think she needs hypertonics.  If her mental status were to deteriorate, decision on hypertonic's can be made at that time. I had a detailed discussion with the family.  She is a DNR at this time but not a DNI.  Stroke risk factor workup as below: -Telemetry monitoring -Allow for permissive hypertension for the first 24-48h - only treat PRN if SBP >180 mmHg. Blood pressures can be gradually normalized to SBP<140 upon  discharge. -MRI brain without contrast -Echocardiogram -HgbA1c, fasting lipid panel -Frequent neuro checks -Prophylactic therapy-Antiplatelet med: Aspirin - dose 325mg  PO or 300mg  PR -Atorvastatin 80 mg PO daily* -Risk factor modification -PT consult, OT consult, Speech consult -Will need long-term anticoagulation -decision on timing of starting to correlation based on clinical course and stroke size on MRI.   Please [place] stroke NP/PA/MD (listed on AMION)  from 8am-4 pm as this patient will be followed by the stroke team at this point.  -- Amie Portland, MD [facility] Pager: [phone] If 7pm to 7am, please call on call as listed on AMION.  CRITICAL CARE ATTESTATION This patient is critically ill and at significant risk of neurological worsening, death and care requires constant monitoring of vital signs, hemodynamics,respiratory and cardiac monitoring. I spent 65  minutes of neurocritical care time performing neurological assessment, discussion with family, other specialists and medical decision making of high complexityin the care of  this patient.

## 2017-04-25 NOTE — ED Notes (Signed)
Critical care at bedside  

## 2017-04-25 NOTE — ED Notes (Signed)
Pamela Chaney, Secretary paged MD. This RN to notify MD of pt HA upon response.

## 2017-04-25 NOTE — Progress Notes (Signed)
PT Cancellation Note  Patient Details Name: Pamela Chaney MRN: [id number] DOB: 31-Jan-1926   Cancelled Treatment:    Reason Eval/Treat Not Completed: Patient not medically ready Pt with new CVA and HTN and going to be admitted to ICU. Will hold until medically appropriate and reattempt as schedule allows.   Leighton Ruff, PT, DPT  Acute Rehabilitation Services  Pager: [phone]    Rudean Hitt 04/25/2017, 11:29 AM

## 2017-04-25 NOTE — ED Notes (Signed)
Daughter, Patrick Jupiter: [phone] Boykin Nearing: [phone]

## 2017-04-25 NOTE — ED Notes (Signed)
Patient still in MRI.  

## 2017-04-25 NOTE — ED Notes (Signed)
Pamela Base, MD responded to [place]. He advised this RN to contact Neuro. Neuro paged by secretary, Karena Addison.

## 2017-04-25 NOTE — ED Notes (Signed)
Pt CBG 179 RN Megan S. Notified.

## 2017-04-25 NOTE — ED Notes (Signed)
Patient returned from MRI.

## 2017-04-25 NOTE — ED Notes (Signed)
Pt CBG was 153, notified Hannah(RN)

## 2017-04-26 ENCOUNTER — Inpatient Hospital Stay (HOSPITAL_COMMUNITY): Payer: Medicare HMO

## 2017-04-26 DIAGNOSIS — I4891 Unspecified atrial fibrillation: Secondary | ICD-10-CM

## 2017-04-26 DIAGNOSIS — I169 Hypertensive crisis, unspecified: Secondary | ICD-10-CM | POA: Diagnosis present

## 2017-04-26 DIAGNOSIS — I161 Hypertensive emergency: Secondary | ICD-10-CM

## 2017-04-26 DIAGNOSIS — I63311 Cerebral infarction due to thrombosis of right middle cerebral artery: Secondary | ICD-10-CM

## 2017-04-26 LAB — BASIC METABOLIC PANEL
Anion gap: 9 (ref 5–15)
BUN: 18 mg/dL (ref 6–20)
CO2: 26 mmol/L (ref 22–32)
CREATININE: 1.13 mg/dL — AB (ref 0.44–1.00)
Calcium: 9.1 mg/dL (ref 8.9–10.3)
Chloride: 107 mmol/L (ref 101–111)
GFR calc Af Amer: 48 mL/min — ABNORMAL LOW (ref >60)
GFR, EST NON AFRICAN AMERICAN: 42 mL/min — AB (ref >60)
Glucose, Bld: 177 mg/dL — ABNORMAL HIGH (ref 65–99)
POTASSIUM: 4.4 mmol/L (ref 3.5–5.1)
Sodium: 142 mmol/L (ref 135–145)

## 2017-04-26 LAB — GLUCOSE, CAPILLARY
GLUCOSE-CAPILLARY: 152 mg/dL — AB (ref 65–99)
GLUCOSE-CAPILLARY: 137 mg/dL — AB (ref 65–99)
GLUCOSE-CAPILLARY: 141 mg/dL — AB (ref 65–99)
Glucose-Capillary: 167 mg/dL — ABNORMAL HIGH (ref 65–99)

## 2017-04-26 LAB — CBC
HCT: 42.6 % (ref 36.0–46.0)
Hemoglobin: 12.8 g/dL (ref 12.0–15.0)
MCH: 28.8 pg (ref 26.0–34.0)
MCHC: 30 g/dL (ref 30.0–36.0)
MCV: 95.9 fL (ref 78.0–100.0)
Platelets: 185 10*3/uL (ref 150–400)
RBC: 4.44 MIL/uL (ref 3.87–5.11)
RDW: 14.5 % (ref 11.5–15.5)
WBC: 8.3 10*3/uL (ref 4.0–10.5)

## 2017-04-26 LAB — ECHOCARDIOGRAM COMPLETE
FS: 33 % (ref 28–44)
Height: 68 in
IV/PV OW: 0.98
LA ID, A-P, ES: 54 mm
LA diam end sys: 54 mm
LA vol index: 46.5 mL/m2
LADIAMINDEX: 2.54 cm/m2
LAVOL: 99.1 mL
LAVOLA4C: 118 mL
LV PW d: 11.5 mm — AB (ref 0.6–1.1)
LVOT area: 2.84 cm2
LVOT diameter: 19 mm
Weight: 3513.25 oz

## 2017-04-26 LAB — SODIUM
SODIUM: 146 mmol/L — AB (ref 135–145)
Sodium: 148 mmol/L — ABNORMAL HIGH (ref 135–145)
Sodium: 150 mmol/L — ABNORMAL HIGH (ref 135–145)

## 2017-04-26 MED ORDER — SODIUM CHLORIDE 3 % IV SOLN
INTRAVENOUS | Status: AC
  Administered 2017-04-26 (×3): 75 mL/h via INTRAVENOUS
  Filled 2017-04-26 (×5): qty 500

## 2017-04-26 MED ORDER — NICARDIPINE HCL IN NACL 20-0.86 MG/200ML-% IV SOLN
3.0000 mg/h | INTRAVENOUS | Status: DC
  Administered 2017-04-26 (×2): 3 mg/h via INTRAVENOUS
  Administered 2017-04-26 – 2017-04-27 (×3): 5 mg/h via INTRAVENOUS
  Administered 2017-04-27 (×2): 6 mg/h via INTRAVENOUS
  Filled 2017-04-26 (×7): qty 200

## 2017-04-26 NOTE — Progress Notes (Signed)
NEUROHOSPITALISTS STROKE TEAM - DAILY PROGRESS NOTE   ADMISSION HISTORY: Pamela Chaney is a 84 y.o. female past medical history of A. fib not on anticoagulant, diabetes, hypertension, hyperlipidemia, congestive heart failure, who is brought in as a code stroke from home [facility].  Initial report said last known normal 6 PM when she was noted to have left-sided facial droop, slurred speech and left-sided weakness.  Upon family arrival, they report that her last definitive seen normal was somewhere around 2 PM to 6 PM is when she was found to be weak on the left side.  She has a history of atrial fibrillation, not on anticoagulation as she discontinued her apixaban because of "wheezing problems with that medication".  She is currently not on anticoagulation.  She has had a stroke in the past based on imaging but she has never had any symptoms related to her stroke. She has a history of congestive heart failure, for which she follows up with cardiology. The [facility], systolic blood pressure in the 250s.  The report complete left-sided neglect, left hemiplegia and right gaze deviation along with the facial droop. Upon initial evaluation in the emergency room, was consistent with complete right MCA syndrome. Noncontrast CT of the head showed hypodensity in majority of the right MCA territory with some petechial bleeding as well along with a poor ASPECTS score of 3.  There was some delay in obtaining IV access and obtaining CTA and perfusion.  CT and perfusion showed a right M1 occlusion with a mismatch of 51 cc.  Case was discussed with neuro interventional specialist and based on the poor aspect score and reasonable core, decided to not be a candidate for intervention. She was past the window for IV TPA upon presentation. At baseline, walks with a cane, drives car, lives independently, performs ADLs  independently.  LKW: 2 PM on 04/25/2017 tpa given?: no, outside the window Premorbid modified Rankin scale (mRS): 2 NIHSS - 20  SUBJECTIVE (INTERVAL HISTORY)  Family is at the bedside. Patient is found laying in bed in NAD. Neuro exam is unchanged.  Patient had CT scan of the head last night showing hemorrhagic transformation and patient was started on hypertonic saline at 75 mL an hour with systolic blood pressure goal below 140. Aspirin was discontinued Patient clinically remains unchanged. Serum sodium is 146 this a.m.   OBJECTIVE Lab Results: CBC:  Recent Labs  Lab 05/17/2017 2217 05/07/2017 2227 04/25/17 0142 04/26/17 0249  WBC 7.3  --  7.6 8.3  HGB 13.0 14.6 12.8 12.8  HCT 43.3 43.0 42.1 42.6  MCV 94.5  --  93.8 95.9  PLT 177  --  177 185   BMP: Recent Labs  Lab 05/10/2017 2217 04/20/2017 2227 04/25/17 0142 04/26/17 0249 04/26/17 1140  NA 137 140  --  142 146*  K 4.6 4.6  --  4.4  --   CL 104 105  --  107  --   CO2 22  --   --  26  --   GLUCOSE 177* 177*  --  177*  --   BUN 29* 38*  --  18  --  CREATININE 1.15* 1.00 1.14* 1.13*  --   CALCIUM 8.6*  --   --  9.1  --    PHYSICAL EXAM Temp:  [98.2 F (36.8 C)-98.8 F (37.1 C)] 98.8 F (37.1 C) (01/10 1242) Pulse Rate:  [69-142] 98 (01/10 1315) Resp:  [18-28] 25 (01/10 1315) BP: (131-179)/(59-96) 156/72 (01/10 1315) SpO2:  [93 %-100 %] 97 % (01/10 1315) Weight:  [219 lb 9.3 oz (99.6 kg)] 219 lb 9.3 oz (99.6 kg) (01/09 1500) General - Well nourished, well developed, in no apparent distress HEENT-  Normocephalic, Normal external eye/conjunctiva.  Normal external ears. Normal external nose, mucus membranes and septum.   Cardiovascular - Regular rate and rhythm  Respiratory - Lungs clear bilaterally. No wheezing. Abdomen - soft and non-tender, BS normal Extremities- no edema or cyanosis NEURO:  Mental Status: AA&Ox2  Language: speech is severely dysarthric.  Naming, repetition, fluency, and comprehension  intact. Cranial Nerves: PERRL. EOM restricted with rightward gaze preference and inability to cross the midline to the left,, visual fields exam shows left homonymous hemianopsia, complete left lower facial weakness, decreased sensation on the left face, hearing intact, tongue/uvula/soft palate midline, normal sternocleidomastoid and trapezius muscle strength. No evidence of tongue atrophy or fibrillations Motor: left dense hemiplegia 0/5 left upper, 0/5 left lower, 5/5 right upper, 5/5 right lower.  Normal tone normal range of motion in the right side, flaccid left upper and lower extremity. Sensation-does not into the left side of her body.  Completely lost sensation to noxious edematous on the left side of the body.  Unable to recognize her left hand when shown. Coordination: FTN intact on the right.  Unable to perform the left Gait- deferred  IMAGING: I have personally reviewed the radiological images below and agree with the radiology interpretations. Ct Angio Head and Neck W Or Wo Contrast and Perfusion Result Date: 04/20/2017  IMPRESSION: 1. Occlusion of the right middle cerebral artery at the M1/M2 junction with complete loss of opacification of the right M2 superior division. 2. Right middle cerebral artery distribution infarction with ischemic penumbra volume measuring 51 mL, primarily concentrated in the posterior zone of the right MCA distribution. 3. Redemonstration of petechial hemorrhage in the distribution of the infarct, unchanged. Small focus of possible subarachnoid blood in the anterior interhemispheric fissure is also unchanged. 4. No hemodynamically significant stenosis of the carotid or vertebral arteries. 5.  Aortic Atherosclerosis (ICD10-I70.0). Critical Value/emergent results were called by telephone at the time of interpretation on 04/27/2017 at 11:53 pm to Dr. Amie Portland , who verbally acknowledged these results. Electronically Signed   By: Ulyses Jarred M.D.   On: 05/06/2017  23:54   Mr Brain Wo Contrast Result Date: 04/25/2017 IMPRESSION: Large territory acute infarct right MCA territory with petechial hemorrhage. Local mass-effect and mild midline shift to the left. Electronically Signed   By: Franchot Gallo M.D.   On: 04/25/2017 08:27   Dg Chest Portable 1 View Result Date: 04/25/2017 IMPRESSION: Mild pulmonary edema with small left pleural effusion. Unchanged cardiomegaly. Electronically Signed   By: Ulyses Jarred M.D.   On: 04/25/2017 01:03   Ct Head Code Stroke Wo Contrast Result Date: 05/02/2017 IMPRESSION: 1. Posterior right MCA territory acute infarction with areas of petechial hemorrhage measuring up to 6.5 mm. No associated midline shift. 2. ASPECTS is 3. 3. Slight increase in size of partially calcified right frontal convexity meningioma without edema or other focal abnormality of the underlying brain. Critical Value/emergent results were called by telephone at the  time of interpretation on 05/07/2017 at 10:57 pm to Dr. Rory Percy , who verbally acknowledged these results. Electronically Signed   By: Ulyses Jarred M.D.   On: 05/04/2017 23:01   Echocardiogram:                                              PENDING Repeat CT Head    04/25/2016                                           1. Hemorrhagic transformation of right middle cerebral artery territory infarction, with new intraparenchymal hematoma measuring 9 mL. 2. Marked worsening of mass effect with 8 mm of leftward midline shift and effacement of the right lateral ventricle.    IMPRESSION: Pamela Chaney is a 84 y.o. female with PMH of  A. fib not on anticoagulant, diabetes, hypertension, hyperlipidemia, congestive heart failure, presenting as acute code stroke with left facial droop, slurred speech and left-sided arm and leg flaccid weakness. Most likely a cardio embolic stroke in the setting of atrial fibrillation not on anticoagulation. Initially thought to be close to the 4-1/2-hour window since last  seen normal but more history obtained from the family put her outside the window for the last known normal at 2 PM. Because of the poor aspects and a petechial bleed noticed on the noncontrast CT, not a candidate for acute intervention. Discussed findings in detail with the family, who understand the current clinical condition she is in and the rationale of therapy going forward. She also presented severely hypertensive, in A. fib with RVR, responded appropriately to metoprolol and is requiring O2 supplementation for maintaining sats. MRI reveals:  Large territory acute infarct right MCA territory with petechial hemorrhage and hemorrhagic transformation  Suspected Etiology: Likely cardio embolic from AFIB. Resultant Symptoms:  left facial droop, slurred speech and left-sided arm and leg flaccid weakness. Stroke Risk Factors: atrial fibrillation, hyperlipidemia and hypertension Other Stroke Risk Factors: Advanced age, Obesity, Body mass index is 33.39 kg/m. , CHF  Outstanding Stroke Work-up Studies:     Echocardiogram:                                                       04/26/2017 ASSESSMENT:   Neuro exam - no worsening, unchanged with Left sided deficits and dysarthria. CT head 04/25/16 p.m. shows hemorrhagic transformation with local mass-effect and 7 mm midline shift to the left. . Patient started on hypertonic saline. PLAN  04/26/2017: HOLD ASA DUE to hemorrhagic transformation  Frequent neuro checks Telemetry monitoring PT/OT/SLP Consult PM & Rehab Consult Case Management /MSW Ongoing aggressive stroke risk factor management Patient will be counseled to be compliant with her antithrombotic medications Patient will be counseled on Lifestyle modifications including, Diet, Exercise, and Stress Follow up with [facility] in 6 weeks  DYSPHAGIA: NPO until passes SLP swallow evaluation Aspiration Precautions in progress  CEREBRAL EDEMA: If her mental status were to  deteriorate, decision on hypertonic's can be made at that time.  MEDICAL ISSUES: Appreciate PCCM consult for assistance with medical management while in  the ICU  AFIB with RVR EKG shows A. Fib Continue Metoprolol for Rate control as needed Elevated Troponin's and BNP Cardiology consultation in AM Continue to HOLD [facility] - Will need long-term anticoagulation -decision on timing of starting to correlation based on clinical course and stroke size on MRI.   RESP Watch respiratory status closely for decompensation. Patient is requiring O2 supplementation for maintaining sats.  CKD Gentle IV hydration Repeat Labs in AM  Congestive heart failure Mild Pulmonary edema on admission CXR TTE pending.  May consider cards consult after test results.  HYPERTENSION: Stable Permissive hypertension - SBP greater than 180 for 24-48 hours post stroke and then gradually  Will start patient on Nicardipine drip, Labetolol PRN Long term BP goal normotensive. May slowly restart home B/P medications after 48 hours Home Meds: Metoprolol  HYPERLIPIDEMIA:    Component Value Date/Time   CHOL 227 (H) 04/25/2017 0353   TRIG 84 04/25/2017 0353   HDL 38 (L) 04/25/2017 0353   CHOLHDL 6.0 04/25/2017 0353   VLDL 17 04/25/2017 0353   LDLCALC 172 (H) 04/25/2017 0353  Home Meds:  NONE LDL  goal < 70 Will start on Lipitor to 80 mg daily, once medically stable Will continue statin at discharge  DIABETES: Lab Results  Component Value Date   HGBA1C 6.6 (H) 04/25/2017  HgbA1c goal < 7.0 Currently on: Novolog Continue CBG monitoring and SSI to maintain glucose 140-180 mg/dl DM education   OBESITY Obesity, Body mass index is 33.39 kg/m. Greater than/equal to 30  Other Active Problems: Active Problems:   Stroke (cerebrum) (HCC)   Atrial fibrillation with rapid ventricular response ([place])   Hypertensive crisis    Hospital day # 1 VTE prophylaxis: SCD's  Diet : Diet NPO time specified   FAMILY  UPDATES: family at bedside  TEAM UPDATES: Garvin Fila, MD STATUS:  She is a DNR at this time but not a DNI.   Prior Home Stroke Medications:  None - per notes she stopped taking AC without medical advice  Discharge Stroke Meds:  Please discharge patient on TBD   Disposition: 06-Home-Health Care Svc Therapy Recs:               PENDING Home Equipment:         PENDING Follow Up:  Follow-up Information    Garvin Fila, MD. Schedule an appointment as soon as possible for a visit in 6 week(s).   Specialties:  Neurology, Radiology Contact information: [address] [place] [address] [phone]          Idelle Crouch, MD -PCP Follow up in 1-2 weeks      I have personally examined this patient, reviewed notes, independently viewed imaging studies, participated in medical decision making and plan of care.ROS completed by me personally and pertinent positives fully documented  I have made any additions or clarifications directly to the above note.    She has presented with a large right middle cerebral artery infarct likely from atrial fibrillation and is at risk for neurological worsening with development of cerebral edema and hemorrhagic transformation.  Family has agreed to DO NOT RESUSCITATE but needs more time to think about palliative care and goals of care. They're agreeable to meet with the palliative care team to decide goals of care. Continue hypertonic saline through peripheral IV for now. Will place central line and be more aggressive on leave family wants to pursue aggressive support discuss with Dr. Carson Myrtle Recommend admit to  ICU and repeat CT scan tomorrow and discuss with family goals of care. This patient is critically ill and at significant risk of neurological worsening, death and care requires constant monitoring of vital signs, hemodynamics,respiratory and cardiac monitoring, extensive review of multiple databases, frequent neurological assessment,  discussion with family, other specialists and medical decision making of high complexity.I have made any additions or clarifications directly to the above note.This critical care time does not reflect procedure time, or teaching time or supervisory time of PA/NP/Med Resident etc but could involve care discussion time.  I spent 40 minutes of neurocritical care time  in the care of  this patient.      Antony Contras, MD Medical Director [facility] Pager: [phone] 04/26/2017 2:24 PM  To contact Stroke Continuity provider, please refer to [email or web address] After hours, contact General Neurology

## 2017-04-26 NOTE — Progress Notes (Signed)
  Echocardiogram 2D Echocardiogram has been performed.  Johny Chess 04/26/2017, 3:09 PM

## 2017-04-26 NOTE — Progress Notes (Signed)
PT Cancellation Note  Patient Details Name: Pamela Chaney MRN: [id number] DOB: 25-Feb-1926   Cancelled Treatment:    Reason Eval/Treat Not Completed: Medical issues which prohibited therapy(per MD pt with medical worsening and not appropriate. Pt with hemorrhagic conversion on hypertonic saline)   Skiler Tye B Mayerly Kaman 04/26/2017, 9:16 AM  Elwyn Reach, [place]

## 2017-04-26 NOTE — Progress Notes (Signed)
   04/26/17 1100  Clinical Encounter Type  Visited With Patient and family together  Visit Type Spiritual support;Social support  Referral From Nurse  Consult/Referral To Chaplain  Spiritual Encounters  Spiritual Needs Emotional;Grief support;Prayer  Stress Factors  Patient Stress Factors Major life changes  Family Stress Factors Lack of knowledge;Family relationships;Exhausted  Chaplain visited with the PT per the consult in the system.  The family is struggling with the decision to have or not have a feeding tube for their loved one. We talked a lot about the will of God and how we can speed up or slow down that will.  The reality is we don't know what decisions we make that will hurry the will of God.  I used a personal story about honoring the PT wishes to help them be more at peace with what decision they make on behalf of their loved one.  We prayed together after a long conversation, read the 91st Psalm and shared more about faith and hope.  Family has a meeting with Palliative Care team consult in the system.

## 2017-04-26 NOTE — Progress Notes (Signed)
Name: Pamela Chaney MRN: [id number] DOB: 10/06/1925    ADMISSION DATE:  05/07/2017 CONSULTATION DATE:  1/9  REFERRING MD : Dr. Regenia Skeeter  CHIEF COMPLAINT: Stroke  HISTORY OF PRESENT ILLNESS: 84 year old female with past medical history as below, which is significant for atrial fibrillation (she is no longer taking anticoagulation), remote colon cancer status post surgery and chemotherapy, coronary artery disease, and diabetes mellitus.  She presented to [facility] emergency department 1/8 after signaling her Life Alert and neighbors finding her with slurred speech and left facial droop.  Last known well at about 1530 that afternoon.  She was profoundly hypertensive for EMS with a systolic blood pressure in the 250s.  She was treated as a code stroke upon arrival to the emergency department and a noncontrast CT of the head demonstrated hypodensity majority of the right MCA territory with some petechial bleeding.  CT angiogram and perfusion showed a right M1 occlusion.  She was not given tPA as she was considered to be outside the window and was a poor candidate for interventional radiology revascularization due to results of the CT perfusion study.  She is referred for ICU admission and PCCM was asked to see.  SIGNIFICANT EVENTS  1/8 present with a CVA admitted to ICU  STUDIES:  CT head 1/8> posterior right MCA territory acute infarction with areas of petechial hemorrhage measuring up to 6.5 mm CT perfusion 1/8> occlusion of the right middle cerebral artery at the M1/M2 junction with complete loss of opacification of the right M2 superior division.  Right MCA distribution infarction with ischemic penumbra volume measuring 51 mL's.  - Interim events: started on 3% saline. Family at the bedside. Son (and wife) and daughter. Son states that the patient has confided in the patient that she should be DNR. Family is concerned that she has declined. Somnolent (was talking yesterday). R facial droop.  Dense L hemiparesis.   PAST MEDICAL HISTORY :  Past Medical History:  Diagnosis Date  . Atrial fibrillation ([place])   . Cancer ([place])    colon  . Coronary artery disease   . Diabetes mellitus without complication ([place])   . DVT (deep venous thrombosis) ([place])   . High cholesterol   . Hypertension   . Peripheral neuropathy   . Pulmonary hypertension ([place])    Past Surgical History:  Procedure Laterality Date  . ABDOMINAL HYSTERECTOMY    . EXPLORATION POST OPERATIVE OPEN HEART      Prior to Admission medications   Medication Sig Start Date End Date Taking? Authorizing Provider  acetaminophen (TYLENOL) 500 MG tablet Take 1 tablet by mouth every 6 (six) hours as needed.    [provider]  aspirin EC 325 MG tablet Take 325 tablets by mouth daily.     [provider]  furosemide (LASIX) 40 MG tablet Take 1 tablet (40 mg total) by mouth daily. 08/12/16   Fritzi Mandes, MD  glipiZIDE (GLUCOTROL) 5 MG tablet Take 1 tablet by mouth 2 (two) times daily. 06/17/14   [provider]  HUMULIN 70/30 KWIKPEN (70-30) 100 UNIT/ML PEN Inject 20 Units into the skin 2 (two) times daily.  08/31/14   [provider]  metoprolol tartrate (LOPRESSOR) 25 MG tablet Take 12.5 mg by mouth 2 (two) times daily.  08/31/14   [provider]  PARoxetine (PAXIL) 20 MG tablet Take 20 mg by mouth daily.  08/31/14   [provider]  Simethicone (GAS RELIEF PO) Take 1 tablet by mouth daily  as needed.  09/17/14   [provider]  traZODone (DESYREL) 50 MG tablet Take 25 mg by mouth daily. 09/15/15   [provider]   Allergies  Allergen Reactions  . Lisinopril Other (See Comments)    Causes weakness.  . Morphine And Related Other (See Comments)    Hallucinations    FAMILY HISTORY:  family history includes Colon cancer in her sister; Heart attack in her father; Pancreatic cancer in her mother. SOCIAL HISTORY:  reports that  has never smoked. she has never  used smokeless tobacco. She reports that she does not drink alcohol or use drugs.  REVIEW OF SYSTEMS:  Unable to obtain due to unresponsive state. SUBJECTIVE:   VITAL SIGNS: Temp:  [98.2 F (36.8 C)-98.5 F (36.9 C)] 98.4 F (36.9 C) (01/10 0729) Pulse Rate:  [69-133] 100 (01/10 0600) Resp:  [15-26] 21 (01/10 0600) BP: (132-179)/(58-96) 143/62 (01/10 0600) SpO2:  [95 %-100 %] 98 % (01/10 0600) Weight:  [99.6 kg (219 lb 9.3 oz)] 99.6 kg (219 lb 9.3 oz) (01/09 1500)  PHYSICAL EXAMINATION: General: Obese elderly female in no acute distress Neuro: Alert, oriented, R gaze preference. R-sided facial droop, twitches in L toes but otherwise dense L hemiparesis. HEENT: Normocephalic, atraumatic, no JVD, PERRL Cardiovascular: Irregularly irregular, rate controlled, no murmurs rubs gallops Lungs: Clear bilateral breath sounds Abdomen: Soft, nontender, nondistended Musculoskeletal: No acute deformity Skin: Grossly intact  Recent Labs  Lab 05/01/2017 2217 05/17/2017 2227 04/25/17 0142 04/26/17 0249  NA 137 140  --  142  K 4.6 4.6  --  4.4  CL 104 105  --  107  CO2 22  --   --  26  BUN 29* 38*  --  18  CREATININE 1.15* 1.00 1.14* 1.13*  GLUCOSE 177* 177*  --  177*   Recent Labs  Lab 05/04/2017 2217 05/03/2017 2227 04/25/17 0142 04/26/17 0249  HGB 13.0 14.6 12.8 12.8  HCT 43.3 43.0 42.1 42.6  WBC 7.3  --  7.6 8.3  PLT 177  --  177 185   Ct Angio Head W Or Wo Contrast  Result Date: 05/08/2017 EXAM: CT ANGIOGRAPHY HEAD AND NECK CT PERFUSION BRAIN TECHNIQUE: Multidetector CT imaging of the head and neck was performed using the standard protocol during bolus administration of intravenous contrast. Multiplanar CT image reconstructions and MIPs were obtained to evaluate the vascular anatomy. Carotid stenosis measurements (when applicable) are obtained utilizing NASCET criteria, using the distal internal carotid diameter as the denominator. Multiphase CT imaging of the brain was performed  following IV bolus contrast injection. Subsequent parametric perfusion maps were calculated using RAPID software. CONTRAST:  184mL ISOVUE-370 IOPAMIDOL (ISOVUE-370) INJECTION 76% COMPARISON:  Head CT 04/28/2017, 11/18/2014 FINDINGS: CTA NECK FINDINGS Aortic arch: There is mild calcific atherosclerosis of the aortic arch. There is no aneurysm, dissection or hemodynamically significant stenosis of the visualized ascending aorta and aortic arch. Conventional 3 vessel aortic branching pattern. There is atherosclerotic calcification within both proximal subclavian arteries without hemodynamically significant stenosis. Right carotid system: The right common carotid origin is widely patent. There is no common carotid or internal carotid artery dissection or aneurysm. There is atherosclerotic calcification of the carotid bifurcation without hemodynamically significant stenosis. Left carotid system: The left common carotid origin is widely patent. There is no common carotid or internal carotid artery dissection or aneurysm. There is atherosclerotic calcification at the carotid bifurcation without hemodynamically significant stenosis. Vertebral arteries: The vertebral system is left dominant. Both vertebral artery origins are patent.  There is atherosclerotic calcification of the right vertebral origin. The right vertebral artery terminates in the posterior inferior cerebellar artery. Skeleton: There is no bony spinal canal stenosis. No lytic or blastic lesions. Other neck: Patulous cervical esophagus. No lymphadenopathy. Normal salivary glands. Normal pharynx and larynx. Heterogeneous thyroid gland. Upper chest: No pneumothorax or pleural effusion. No nodules or masses. Review of the MIP images confirms the above findings CTA HEAD FINDINGS Anterior circulation: --Intracranial internal carotid arteries: Atherosclerotic calcification bilaterally without hemodynamically significant stenosis. --Anterior cerebral arteries: Normal.  --Middle cerebral arteries: The right middle cerebral artery is occluded at the M1/M 2 junction. There is persistent opacification of the inferior division and its distal branches. The left middle cerebral artery is normal. --Posterior communicating arteries: Present bilaterally. Posterior circulation: --Posterior cerebral arteries: Normal. --Superior cerebellar arteries: Normal. --Basilar artery: Normal. --Anterior inferior cerebellar arteries: Normal. --Posterior inferior cerebellar arteries: Normal. Venous sinuses: As permitted by contrast timing, patent. Anatomic variants: Bilateral posterior cerebral artery fetal origins. Right PICA arises from the termination of the right vertebral artery. Delayed phase: No parenchymal contrast enhancement. Petechial hemorrhage within the distribution of the infarction. Possible small focus of subarachnoid blood in the interhemispheric fissure. Review of the MIP images confirms the above findings. CT Brain Perfusion Findings: CBF (<30%) Volume: 61mL Perfusion (Tmax>6.0s) volume: 31mL Mismatch Volume: 5mL Infarction Location:Midportion of the right middle cerebral artery. The penumbra is primarily in the posterior zone of the middle cerebral artery distribution IMPRESSION: 1. Occlusion of the right middle cerebral artery at the M1/M2 junction with complete loss of opacification of the right M2 superior division. 2. Right middle cerebral artery distribution infarction with ischemic penumbra volume measuring 51 mL, primarily concentrated in the posterior zone of the right MCA distribution. 3. Redemonstration of petechial hemorrhage in the distribution of the infarct, unchanged. Small focus of possible subarachnoid blood in the anterior interhemispheric fissure is also unchanged. 4. No hemodynamically significant stenosis of the carotid or vertebral arteries. 5.  Aortic Atherosclerosis (ICD10-I70.0). Critical Value/emergent results were called by telephone at the time of  interpretation on 05/13/2017 at 11:53 pm to Dr. Amie Portland , who verbally acknowledged these results. Electronically Signed   By: Ulyses Jarred M.D.   On: 05/11/2017 23:54   Ct Head Wo Contrast  Addendum Date: 04/26/2017   ADDENDUM REPORT: 04/26/2017 01:04 ADDENDUM: These results were called by telephone at the time of interpretation on 04/26/2017 at 1:04 am to Dr. Rory Percy, who verbally acknowledged these results. Electronically Signed   By: Ulyses Jarred M.D.   On: 04/26/2017 01:04   Result Date: 04/26/2017 CLINICAL DATA:  Headache.  Stroke follow-up. EXAM: CT HEAD WITHOUT CONTRAST TECHNIQUE: Contiguous axial images were obtained from the base of the skull through the vertex without intravenous contrast. COMPARISON:  Brain MRI 04/25/2017 FINDINGS: Brain: An intraparenchymal hematoma has developed within the area of right MCA infarction, measuring 2.7 x 3.4 x 1.9 cm (volume = 9.1 cm^3). There is associated effacement of the right lateral ventricle with leftward midline shift that measures 8 mm. There are multiple small areas of petechial hemorrhage scattered within the right MCA distribution. There is currently no hydrocephalus. There is leftward bulging of the falx cerebri without frank herniation of the cingulate gyrus. Vascular: No hyperdense vessel or unexpected calcification. Skull: Normal. Negative for fracture or focal lesion. Sinuses/Orbits: No acute finding. Other: None. IMPRESSION: 1. Hemorrhagic transformation of right middle cerebral artery territory infarction, with new intraparenchymal hematoma measuring 9 mL. 2. Marked worsening of mass effect with  8 mm of leftward midline shift and effacement of the right lateral ventricle. 3. No hydrocephalus. Electronically Signed: By: Ulyses Jarred M.D. On: 04/26/2017 00:52   Ct Angio Neck W Or Wo Contrast  Result Date: 04/27/2017 EXAM: CT ANGIOGRAPHY HEAD AND NECK CT PERFUSION BRAIN TECHNIQUE: Multidetector CT imaging of the head and neck was performed  using the standard protocol during bolus administration of intravenous contrast. Multiplanar CT image reconstructions and MIPs were obtained to evaluate the vascular anatomy. Carotid stenosis measurements (when applicable) are obtained utilizing NASCET criteria, using the distal internal carotid diameter as the denominator. Multiphase CT imaging of the brain was performed following IV bolus contrast injection. Subsequent parametric perfusion maps were calculated using RAPID software. CONTRAST:  159mL ISOVUE-370 IOPAMIDOL (ISOVUE-370) INJECTION 76% COMPARISON:  Head CT 04/18/2017, 11/18/2014 FINDINGS: CTA NECK FINDINGS Aortic arch: There is mild calcific atherosclerosis of the aortic arch. There is no aneurysm, dissection or hemodynamically significant stenosis of the visualized ascending aorta and aortic arch. Conventional 3 vessel aortic branching pattern. There is atherosclerotic calcification within both proximal subclavian arteries without hemodynamically significant stenosis. Right carotid system: The right common carotid origin is widely patent. There is no common carotid or internal carotid artery dissection or aneurysm. There is atherosclerotic calcification of the carotid bifurcation without hemodynamically significant stenosis. Left carotid system: The left common carotid origin is widely patent. There is no common carotid or internal carotid artery dissection or aneurysm. There is atherosclerotic calcification at the carotid bifurcation without hemodynamically significant stenosis. Vertebral arteries: The vertebral system is left dominant. Both vertebral artery origins are patent. There is atherosclerotic calcification of the right vertebral origin. The right vertebral artery terminates in the posterior inferior cerebellar artery. Skeleton: There is no bony spinal canal stenosis. No lytic or blastic lesions. Other neck: Patulous cervical esophagus. No lymphadenopathy. Normal salivary glands. Normal  pharynx and larynx. Heterogeneous thyroid gland. Upper chest: No pneumothorax or pleural effusion. No nodules or masses. Review of the MIP images confirms the above findings CTA HEAD FINDINGS Anterior circulation: --Intracranial internal carotid arteries: Atherosclerotic calcification bilaterally without hemodynamically significant stenosis. --Anterior cerebral arteries: Normal. --Middle cerebral arteries: The right middle cerebral artery is occluded at the M1/M 2 junction. There is persistent opacification of the inferior division and its distal branches. The left middle cerebral artery is normal. --Posterior communicating arteries: Present bilaterally. Posterior circulation: --Posterior cerebral arteries: Normal. --Superior cerebellar arteries: Normal. --Basilar artery: Normal. --Anterior inferior cerebellar arteries: Normal. --Posterior inferior cerebellar arteries: Normal. Venous sinuses: As permitted by contrast timing, patent. Anatomic variants: Bilateral posterior cerebral artery fetal origins. Right PICA arises from the termination of the right vertebral artery. Delayed phase: No parenchymal contrast enhancement. Petechial hemorrhage within the distribution of the infarction. Possible small focus of subarachnoid blood in the interhemispheric fissure. Review of the MIP images confirms the above findings. CT Brain Perfusion Findings: CBF (<30%) Volume: 47mL Perfusion (Tmax>6.0s) volume: 24mL Mismatch Volume: 90mL Infarction Location:Midportion of the right middle cerebral artery. The penumbra is primarily in the posterior zone of the middle cerebral artery distribution IMPRESSION: 1. Occlusion of the right middle cerebral artery at the M1/M2 junction with complete loss of opacification of the right M2 superior division. 2. Right middle cerebral artery distribution infarction with ischemic penumbra volume measuring 51 mL, primarily concentrated in the posterior zone of the right MCA distribution. 3.  Redemonstration of petechial hemorrhage in the distribution of the infarct, unchanged. Small focus of possible subarachnoid blood in the anterior interhemispheric fissure is also unchanged. 4.  No hemodynamically significant stenosis of the carotid or vertebral arteries. 5.  Aortic Atherosclerosis (ICD10-I70.0). Critical Value/emergent results were called by telephone at the time of interpretation on 04/26/2017 at 11:53 pm to Dr. Amie Portland , who verbally acknowledged these results. Electronically Signed   By: Ulyses Jarred M.D.   On: 04/23/2017 23:54   Mr Brain Wo Contrast  Result Date: 04/25/2017 CLINICAL DATA:  Stroke EXAM: MRI HEAD WITHOUT CONTRAST TECHNIQUE: Multiplanar, multiecho pulse sequences of the brain and surrounding structures were obtained without intravenous contrast. COMPARISON:  CT 04/28/2017 FINDINGS: Brain: Large territory right MCA infarct. Acute infarct is present throughout much of the right temporal lobe extending into a large portion of the parietal lobe and some of the right frontal lobe. Much of the basal ganglia involved. Multiple areas of petechial hemorrhage are present within the infarct. No other areas of acute infarct. Ventricle size is normal. Mild midline shift to the left approximately 2 mm. No mass lesion. Vascular: Normal arterial flow voids Skull and upper cervical spine: Negative Sinuses/Orbits: Negative Other: None IMPRESSION: Large territory acute infarct right MCA territory with petechial hemorrhage. Local mass-effect and mild midline shift to the left. Electronically Signed   By: Franchot Gallo M.D.   On: 04/25/2017 08:27   Ct Cerebral Perfusion W Contrast  Result Date: 04/17/2017 EXAM: CT ANGIOGRAPHY HEAD AND NECK CT PERFUSION BRAIN TECHNIQUE: Multidetector CT imaging of the head and neck was performed using the standard protocol during bolus administration of intravenous contrast. Multiplanar CT image reconstructions and MIPs were obtained to evaluate the vascular  anatomy. Carotid stenosis measurements (when applicable) are obtained utilizing NASCET criteria, using the distal internal carotid diameter as the denominator. Multiphase CT imaging of the brain was performed following IV bolus contrast injection. Subsequent parametric perfusion maps were calculated using RAPID software. CONTRAST:  140mL ISOVUE-370 IOPAMIDOL (ISOVUE-370) INJECTION 76% COMPARISON:  Head CT 05/11/2017, 11/18/2014 FINDINGS: CTA NECK FINDINGS Aortic arch: There is mild calcific atherosclerosis of the aortic arch. There is no aneurysm, dissection or hemodynamically significant stenosis of the visualized ascending aorta and aortic arch. Conventional 3 vessel aortic branching pattern. There is atherosclerotic calcification within both proximal subclavian arteries without hemodynamically significant stenosis. Right carotid system: The right common carotid origin is widely patent. There is no common carotid or internal carotid artery dissection or aneurysm. There is atherosclerotic calcification of the carotid bifurcation without hemodynamically significant stenosis. Left carotid system: The left common carotid origin is widely patent. There is no common carotid or internal carotid artery dissection or aneurysm. There is atherosclerotic calcification at the carotid bifurcation without hemodynamically significant stenosis. Vertebral arteries: The vertebral system is left dominant. Both vertebral artery origins are patent. There is atherosclerotic calcification of the right vertebral origin. The right vertebral artery terminates in the posterior inferior cerebellar artery. Skeleton: There is no bony spinal canal stenosis. No lytic or blastic lesions. Other neck: Patulous cervical esophagus. No lymphadenopathy. Normal salivary glands. Normal pharynx and larynx. Heterogeneous thyroid gland. Upper chest: No pneumothorax or pleural effusion. No nodules or masses. Review of the MIP images confirms the above  findings CTA HEAD FINDINGS Anterior circulation: --Intracranial internal carotid arteries: Atherosclerotic calcification bilaterally without hemodynamically significant stenosis. --Anterior cerebral arteries: Normal. --Middle cerebral arteries: The right middle cerebral artery is occluded at the M1/M 2 junction. There is persistent opacification of the inferior division and its distal branches. The left middle cerebral artery is normal. --Posterior communicating arteries: Present bilaterally. Posterior circulation: --Posterior cerebral arteries: Normal. --Superior cerebellar arteries:  Normal. --Basilar artery: Normal. --Anterior inferior cerebellar arteries: Normal. --Posterior inferior cerebellar arteries: Normal. Venous sinuses: As permitted by contrast timing, patent. Anatomic variants: Bilateral posterior cerebral artery fetal origins. Right PICA arises from the termination of the right vertebral artery. Delayed phase: No parenchymal contrast enhancement. Petechial hemorrhage within the distribution of the infarction. Possible small focus of subarachnoid blood in the interhemispheric fissure. Review of the MIP images confirms the above findings. CT Brain Perfusion Findings: CBF (<30%) Volume: 27mL Perfusion (Tmax>6.0s) volume: 63mL Mismatch Volume: 59mL Infarction Location:Midportion of the right middle cerebral artery. The penumbra is primarily in the posterior zone of the middle cerebral artery distribution IMPRESSION: 1. Occlusion of the right middle cerebral artery at the M1/M2 junction with complete loss of opacification of the right M2 superior division. 2. Right middle cerebral artery distribution infarction with ischemic penumbra volume measuring 51 mL, primarily concentrated in the posterior zone of the right MCA distribution. 3. Redemonstration of petechial hemorrhage in the distribution of the infarct, unchanged. Small focus of possible subarachnoid blood in the anterior interhemispheric fissure is  also unchanged. 4. No hemodynamically significant stenosis of the carotid or vertebral arteries. 5.  Aortic Atherosclerosis (ICD10-I70.0). Critical Value/emergent results were called by telephone at the time of interpretation on 05/12/2017 at 11:53 pm to Dr. Amie Portland , who verbally acknowledged these results. Electronically Signed   By: Ulyses Jarred M.D.   On: 04/23/2017 23:54   Dg Chest Port 1 View  Result Date: 04/26/2017 CLINICAL DATA:  CVA EXAM: PORTABLE CHEST 1 VIEW COMPARISON:  05/09/2017 FINDINGS: Postop CAB Cardiac enlargement. Mild vascular congestion without edema or effusion. Mild bibasilar atelectasis unchanged. IMPRESSION: Stable chest x-ray with mild bibasilar atelectasis unchanged. Electronically Signed   By: Franchot Gallo M.D.   On: 04/26/2017 08:06   Dg Chest Portable 1 View  Result Date: 04/25/2017 CLINICAL DATA:  Acute hypoxia EXAM: PORTABLE CHEST 1 VIEW COMPARISON:  Chest radiograph 08/10/2016 FINDINGS: Unchanged cardiomegaly with sequelae of coronary artery bypass grafting. Bilateral mild parahilar opacities. No focal consolidation. Small left pleural effusion. No pneumothorax. IMPRESSION: Mild pulmonary edema with small left pleural effusion. Unchanged cardiomegaly. Electronically Signed   By: Ulyses Jarred M.D.   On: 04/25/2017 01:03   Ct Head Code Stroke Wo Contrast  Result Date: 04/20/2017 CLINICAL DATA:  Code stroke. Right-sided facial droop. Other left-sided deficits. EXAM: CT HEAD WITHOUT CONTRAST TECHNIQUE: Contiguous axial images were obtained from the base of the skull through the vertex without intravenous contrast. COMPARISON:  Head CT 11/18/2014 FINDINGS: Brain: There is hypoattenuation throughout the posterior right MCA distribution. There are small areas of petechial hemorrhage measuring up to 6 mm. There is no space-occupying hematoma. A calcified meningioma along the right frontal convexity has slightly increased in size, now measuring 2.8 cm, previously 2.4 cm. No  significant mass effect on the underlying brain. Focal area of encephalomalacia in the right frontal lobe is unchanged. There is no midline shift or mass effect. There is an old left cerebellar infarct. Size and configuration of the ventricles are normal. Vascular: Symmetric vascular density. Skull: No calvarial or skull base abnormality. Sinuses/Orbits: No acute finding. Other: None. ASPECTS [facility] Stroke Program Early CT Score) - Ganglionic level infarction (caudate, lentiform nuclei, internal capsule, insula, M1-M3 cortex): 0 - Supraganglionic infarction (M4-M6 cortex): 3 Total score (0-10 with 10 being normal): 3 IMPRESSION: 1. Posterior right MCA territory acute infarction with areas of petechial hemorrhage measuring up to 6.5 mm. No associated midline shift. 2. ASPECTS is 3. 3.  Slight increase in size of partially calcified right frontal convexity meningioma without edema or other focal abnormality of the underlying brain. Critical Value/emergent results were called by telephone at the time of interpretation on 05/03/2017 at 10:57 pm to Dr. Rory Percy , who verbally acknowledged these results. Electronically Signed   By: Ulyses Jarred M.D.   On: 05/05/2017 23:01    ASSESSMENT / PLAN: ACUTE R MCA CVA with hemorrhagic transformation, suspected to be cardioembolic in the setting of atrial fibrillation without anticoagulation.  She did not receive tPA because she was outside the window, and was felt to be a poor candidate for an interventional radiology procedure. - Per neurology/stroke service - MRI brain - Echo PENDING - ASA, atorvastatin  - CODE STATUS: DNR  Atrial fibrillation with RVR: rate now controlled after one dose IV metoprolol - Metoprolol PRN - Telemetry monitoring - Hold anticoagulation at this time - Echo PENDING  Hypertensive Crisis: BP improved - PRN nicardipine, metoprolol - Permissive hypertension up to 166mmHg - Holding outpatient lasix, metoprolol  DISPOSITION: keep in  ICU  Renee Pain, MD [facility] Pulmonology/Critical Care Pager [phone] or [phone]  04/26/2017 8:32 AM

## 2017-04-26 NOTE — Progress Notes (Signed)
Received orders for 3% saline at 0230. Paged Pharmacy twice and called, pt didn't receive 3% saline until 0450. 3% saline is now running at 75 ml/hr peripherally.   Will continue to monitor.

## 2017-04-26 NOTE — Progress Notes (Signed)
Overnight cross cover note  Subjective: Received call from radiology regarding the results of the repeat CT scan of the head.  There is now a hemorrhagic transformation measuring 3.4 cm x 2.6 cm in the area of the right MCA infarct, totaling about 9 cc causing about a 7.7 mm midline shift to the left as well.  Effacement of the right lateral ventricle.  No hydrocephalus Patient remains unchanged clinically.    Objective: Vitals:   04/25/17 2300 04/26/17 0000  BP: (!) 152/78   Pulse: (!) 101   Resp: (!) 21   Temp:  98.3 F (36.8 C)  SpO2: 99%     Drowsy Wakes to voice PERRL Able to tell me her name Gaze pref to right Plegic on left  CTH - increased HT  Assessment: 84 year old woman with a right MCA stroke likely secondary to her atrial fibrillation, now with hemorrhagic transformation of around 9 cc and causing mass-effect on the lateral ventricle as well as midline shift of about 8 mm.  IMP Acute ischemic stroke - with HT and worsening cerebral edema and MLS, no hydrocephalus. Afib CHF  Plan -d/c ASA and do not use anticoagulants for DVT ppx - I have discontinued both -Systolic blood pressure goal less than 140 mmHg given increased HT - use cardene PRN -Start 3% saline at 75 cc an hour for a goal sodium of 145-150 via peripheral IV for now. -Check sodiums every 6 hours -Might need central line going forward for HTS -No need for surgical intervention at this time as there is no hydrocephalus.  Given her age and premorbid poor medical history, she will not be a good candidate for hemicrani  -Spoke with daughter Ms. Madaline Savage over phone and updated her with the plan and the [place] findings.  -- Amie Portland, MD [facility] Pager: [phone] If 7pm to 7am, please call on call as listed on AMION.

## 2017-04-26 NOTE — Progress Notes (Signed)
OT Cancellation Note  Patient Details Name: Pamela Chaney MRN: [id number] DOB: November 20, 1925   Cancelled Treatment:    Reason Eval/Treat Not Completed: Patient not medically ready. Asked to hold today per NP due to worsening status.   [place], OT/L  [phone] 04/26/2017 04/26/2017, 9:17 AM

## 2017-04-26 NOTE — Evaluation (Signed)
Clinical/Bedside Swallow Evaluation Patient Details  Name: Pamela Chaney MRN: [id number] Date of Birth: 1925-05-08  Today's Date: 04/26/2017 Time: SLP Start Time (ACUTE ONLY): 1135 SLP Stop Time (ACUTE ONLY): 1209 SLP Time Calculation (min) (ACUTE ONLY): 34 min  Past Medical History:  Past Medical History:  Diagnosis Date  . Atrial fibrillation ([place])   . Cancer ([place])    colon  . Coronary artery disease   . Diabetes mellitus without complication ([place])   . DVT (deep venous thrombosis) ([place])   . High cholesterol   . Hypertension   . Peripheral neuropathy   . Pulmonary hypertension ([place])    Past Surgical History:  Past Surgical History:  Procedure Laterality Date  . ABDOMINAL HYSTERECTOMY    . EXPLORATION POST OPERATIVE OPEN HEART     HPI:  84 year old female admitted with right MCA CVA, now with hemorrhagic transformation measuring 3.4 cm x 2.6 cm in the area of the right MCA infarct, totaling about 9 cc causing about a 7.7 mm midline shift to the left as well. Effacement of the right lateral ventricle. No hydrocephalus.    Assessment / Plan / Recommendation Clinical Impression  Bedside swallow evaluation complete with severe lethargy being primary barrier to progression at this time. Patient arousable for 3-5 second intervals before falling back to sleep. Po trials provided with intact but limited oral manipulation of bolus and initiation of swallow, again due to poor alertness. Note that vocal quality clear however both at baseline and following limited ice chips trials suggestive of possibly intact airway protection. Ongoing diagnostics will be required. Family present and educated regarding current performance, all questions answered. As family verbalizing struggle with decisions regarding patient's plan of care, consider short term-non oral means of nutrition with ongoing SLP treatment to monitor for potential improvements in alertness, particularly in light of what appears to  be at least partially intact cognitive-linguistic skills and intact management of secretions during periods of arousal.  SLP Visit Diagnosis: Dysphagia, unspecified (R13.10)    Aspiration Risk  Severe aspiration risk(given poor alertness)    Diet Recommendation NPO   Medication Administration: Via alternative means    Other  Recommendations Oral Care Recommendations: Oral care QID   Follow up Recommendations (TBD)      Frequency and Duration min 2x/week  2 weeks       Prognosis Prognosis for Safe Diet Advancement: Good(with improved alertness)      Swallow Study   General HPI: 84 year old female admitted with right MCA CVA, now with hemorrhagic transformation measuring 3.4 cm x 2.6 cm in the area of the right MCA infarct, totaling about 9 cc causing about a 7.7 mm midline shift to the left as well. Effacement of the right lateral ventricle. No hydrocephalus.  Type of Study: Bedside Swallow Evaluation Previous Swallow Assessment: none Diet Prior to this Study: NPO Temperature Spikes Noted: No Respiratory Status: Nasal cannula History of Recent Intubation: No Behavior/Cognition: Lethargic/Drowsy Oral Cavity Assessment: Dry Oral Care Completed by SLP: Yes Oral Cavity - Dentition: Adequate natural dentition Vision: Functional for self-feeding Self-Feeding Abilities: Needs assist Patient Positioning: Upright in bed Baseline Vocal Quality: Normal Volitional Cough: Strong Volitional Swallow: Able to elicit    Oral/Motor/Sensory Function Overall Oral Motor/Sensory Function: Mild impairment(largely generalized weakness) Facial ROM: Reduced left Facial Symmetry: Abnormal symmetry left Lingual Symmetry: Abnormal symmetry left   Ice Chips Ice chips: Impaired Presentation: Spoon Oral Phase Impairments: Impaired mastication Oral Phase Functional Implications: Oral residue;Oral holding;Prolonged oral transit  Thin Liquid Thin Liquid: Not tested    Nectar Thick Nectar Thick  Liquid: Not tested   Honey Thick Honey Thick Liquid: Not tested   Puree Puree: Not tested   Solid   Pamela Rainwater Pamela Chaney, Pamela Chaney [phone]    Solid: Not tested        Pamela Chaney Meryl 04/26/2017,12:29 PM

## 2017-04-26 NOTE — Progress Notes (Signed)
Palliative Medicine consult noted. Due to high referral volume, there may be a delay seeing this patient. Please call the Palliative Medicine Team office at [phone] if recommendations are needed in the interim.  Thank you for inviting [place] to see this patient.  Marjie Skiff Alexes Menchaca, RN, BSN, [facility] Palliative Medicine Team 04/26/2017 4:03 PM Office [phone]

## 2017-04-26 NOTE — Evaluation (Signed)
Speech Language Pathology Evaluation Patient Details Name: HULA TASSO MRN: [id number] DOB: 01/25/1926 Today's Date: 04/26/2017 Time: 1135-1209 SLP Time Calculation (min) (ACUTE ONLY): 34 min  Problem List:  Patient Active Problem List   Diagnosis Date Noted  . Atrial fibrillation with rapid ventricular response ([place]) 04/26/2017  . Hypertensive crisis 04/26/2017  . Stroke (cerebrum) ([place]) 04/25/2017  . CHF (congestive heart failure) ([place]) 08/10/2016   Past Medical History:  Past Medical History:  Diagnosis Date  . Atrial fibrillation ([place])   . Cancer ([place])    colon  . Coronary artery disease   . Diabetes mellitus without complication ([place])   . DVT (deep venous thrombosis) ([place])   . High cholesterol   . Hypertension   . Peripheral neuropathy   . Pulmonary hypertension ([place])    Past Surgical History:  Past Surgical History:  Procedure Laterality Date  . ABDOMINAL HYSTERECTOMY    . EXPLORATION POST OPERATIVE OPEN HEART     HPI:  84 year old female admitted with right MCA CVA, now with hemorrhagic transformation measuring 3.4 cm x 2.6 cm in the area of the right MCA infarct, totaling about 9 cc causing about a 7.7 mm midline shift to the left as well. Effacement of the right lateral ventricle. No hydrocephalus.    Assessment / Plan / Recommendation Clinical Impression  Cognitive-linguistic evaluation complete. Patient with severe lethargy, sleeping for most of evaluation however able to arouse for brief 3-5 second intervals with verbal cueing. When aroused, patient oriented to self and place, able to follow 1-step commands, fluent and appropriate verbal ouput, and evidence of intelletual awareness of current situation. Family reports intervals of appropriate involvement in conversation intermittently. Unable to determine extent of deficits however appears based on today's evaluation that severity of lethargy is primary barrier at this time. SLP will f/u for continued  diagnostic treatment.     SLP Assessment  SLP Recommendation/Assessment: Patient needs continued Speech Lanaguage Pathology Services SLP Visit Diagnosis: Cognitive communication deficit (R41.841)    Follow Up Recommendations  (TBD)    Frequency and Duration min 2x/week  2 weeks      SLP Evaluation Cognition  Overall Cognitive Status: Impaired/Different from baseline Arousal/Alertness: Lethargic Orientation Level: Oriented to person;Oriented to place Attention: Focused Focused Attention: Appears intact Comments: ability to fully assess cognition impacted by lethargy       Comprehension  Auditory Comprehension Overall Auditory Comprehension: Appears within functional limits for tasks assessed(see clinical impression statement) Yes/No Questions: Within Functional Limits    Expression Expression Primary Mode of Expression: Verbal Verbal Expression Overall Verbal Expression: Appears within functional limits for tasks assessed   Oral / Motor  Oral Motor/Sensory Function Overall Oral Motor/Sensory Function: Mild impairment(generalized weakness) Facial ROM: Reduced left Facial Symmetry: Abnormal symmetry left Lingual Symmetry: Abnormal symmetry left   GO            Gabriel Rainwater MA, CCC-SLP [phone]         Oval Moralez Meryl 04/26/2017, 12:26 PM

## 2017-04-27 ENCOUNTER — Inpatient Hospital Stay (HOSPITAL_COMMUNITY): Payer: Medicare HMO

## 2017-04-27 DIAGNOSIS — Z515 Encounter for palliative care: Secondary | ICD-10-CM

## 2017-04-27 DIAGNOSIS — I169 Hypertensive crisis, unspecified: Secondary | ICD-10-CM

## 2017-04-27 LAB — GLUCOSE, CAPILLARY
GLUCOSE-CAPILLARY: 160 mg/dL — AB (ref 65–99)
GLUCOSE-CAPILLARY: 152 mg/dL — AB (ref 65–99)
Glucose-Capillary: 159 mg/dL — ABNORMAL HIGH (ref 65–99)
Glucose-Capillary: 190 mg/dL — ABNORMAL HIGH (ref 65–99)

## 2017-04-27 LAB — SODIUM
SODIUM: 152 mmol/L — AB (ref 135–145)
SODIUM: 154 mmol/L — AB (ref 135–145)
SODIUM: 156 mmol/L — AB (ref 135–145)
SODIUM: 156 mmol/L — AB (ref 135–145)

## 2017-04-27 MED ORDER — SODIUM CHLORIDE 3 % IV SOLN
INTRAVENOUS | Status: DC
  Filled 2017-04-27 (×2): qty 500

## 2017-04-27 MED ORDER — JEVITY 1.2 CAL PO LIQD
1000.0000 mL | ORAL | Status: DC
  Administered 2017-04-27: 1000 mL
  Filled 2017-04-27 (×5): qty 1000

## 2017-04-27 MED ORDER — PAROXETINE HCL 20 MG PO TABS: 20.0000 mg | ORAL_TABLET | Freq: Every day | ORAL | Status: DC

## 2017-04-27 MED ORDER — PAROXETINE HCL 20 MG PO TABS
20.0000 mg | ORAL_TABLET | Freq: Every day | ORAL | Status: DC
  Administered 2017-04-28: 20 mg via ORAL
  Filled 2017-04-27: qty 1

## 2017-04-27 MED ORDER — CHLORHEXIDINE GLUCONATE 0.12 % MT SOLN
15.0000 mL | Freq: Two times a day (BID) | OROMUCOSAL | Status: DC
  Administered 2017-04-27 – 2017-04-29 (×5): 15 mL via OROMUCOSAL
  Filled 2017-04-27: qty 15

## 2017-04-27 MED ORDER — ORAL CARE MOUTH RINSE
15.0000 mL | Freq: Two times a day (BID) | OROMUCOSAL | Status: DC
  Administered 2017-04-27 – 2017-04-29 (×5): 15 mL via OROMUCOSAL

## 2017-04-27 MED ORDER — PRO-STAT SUGAR FREE PO LIQD
30.0000 mL | Freq: Every day | ORAL | Status: DC
  Administered 2017-04-27 – 2017-04-28 (×2): 30 mL
  Filled 2017-04-27: qty 30

## 2017-04-27 MED ORDER — METOPROLOL TARTRATE 25 MG PO TABS
12.5000 mg | ORAL_TABLET | Freq: Two times a day (BID) | ORAL | Status: DC
  Administered 2017-04-27 – 2017-04-28 (×2): 12.5 mg via ORAL
  Filled 2017-04-27 (×2): qty 1

## 2017-04-27 MED ORDER — SODIUM CHLORIDE 3 % IV SOLN
INTRAVENOUS | Status: DC
  Administered 2017-04-27: 75 mL/h via INTRAVENOUS
  Filled 2017-04-27 (×8): qty 500

## 2017-04-27 NOTE — Progress Notes (Signed)
Spoke to daughter, Corbin Ade. Appt scheduled for GOC, specifically discussion on feeding tube, for 04/28/17 @1300 . If recommendations are needed sooner, please call [phone]. This number is answered until 7pm. Thank you, Romona Curls, ANP

## 2017-04-27 NOTE — Progress Notes (Signed)
[facility] Pulmonary Diseases & Critical Care Medicine Progress Note  Patient Name: Pamela Chaney MRN: [id number] DOB: [birth year]    ADMISSION DATE:  05/01/2017 CONSULTATION DATE:  1//11/2017  REFERRING MD:  Dr. Regenia Skeeter  CHIEF COMPLAINT:  stroke   ASSESSMENT/PLAN:  ASSESSMENT (included in the Hospital Problem List)  Patient Active Problem List   Diagnosis Date Noted  . Atrial fibrillation with RVR ([place]) 04/26/2017    Priority: High  . Hypertensive crisis 04/26/2017    Priority: High  . Hypertensive emergency   . Acute ischemic right MCA stroke ([place]) 04/25/2017  . CHF (congestive heart failure) ([place]) 08/10/2016    PLAN/RECOMMENDATIONS  Per neurology/stroke service. Case discussed with Dr. Leonie Man. Permissive hypertension up to systolic blood pressure of 140 mmHg Off aspirin due to hemorrhagic transformation. Continue atorvastatin. Place Dobhoff tube Metoprolol PRN for rate control. No anticoagulation for atrial fibrillation SCDs for DVT prophylaxis PRN nicardipine, metoprolol     SUBJECTIVE:   -Interim events: Echo done. No clot identified. Talks and answers questions. Follows some commands. Had speech evaluation yesterday and failed. on 3% saline. Family at the bedside. Daughter reports that the family has discussed her care and which to push on with supportive care for now (remains DNR, but unwilling to withdraw care or to transition to comfort care only). Somnolent but arousable. R facial droop. Dense L hemiparesis   HISTORY OF PRESENT ILLNESS This 84 y.o. Caucasian female was originally admitted on 05/09/2017 with acute R MCA stroke, suspected to be cardioembolic with a history of atrial fibrillation (she is no longer taking anticoagulation), remote colon cancer status post surgery and chemotherapy, coronary artery disease, and diabetes mellitus.  She presented to [facility] emergency department 1/8 after signaling her Life Alert and neighbors finding her with  slurred speech and left facial droop.  Last known well at about 1530 that afternoon.  She was profoundly hypertensive for EMS with a systolic blood pressure in the 250s.  She was treated as a code stroke upon arrival to the emergency department and a noncontrast CT of the head demonstrated hypodensity in a majority of the right MCA territory with some petechial bleeding.  CT angiogram and perfusion showed a right M1 occlusion.  She was not given tPA as she was considered to be outside the window and was a poor candidate for interventional radiology revascularization due to results of the CT perfusion study. She was admitted to the ICU.  REVIEW OF SYSTEMS As highlighted above and in the HPI. Otherwise, the remainder of the balance of a review of 13 systems is negative.   PAST MEDICAL/SURGICAL/SOCIAL/FAMILY HISTORY  Past Medical History:  Diagnosis Date  . Atrial fibrillation ([place])   . Cancer ([place])    colon  . Coronary artery disease   . Diabetes mellitus without complication ([place])   . DVT (deep venous thrombosis) ([place])   . High cholesterol   . Hypertension   . Peripheral neuropathy   . Pulmonary hypertension ([place])     Past Surgical History:  Procedure Laterality Date  . ABDOMINAL HYSTERECTOMY    . EXPLORATION POST OPERATIVE OPEN HEART      Social History   Tobacco Use  . Smoking status: Never Smoker  . Smokeless tobacco: Never Used  Substance Use Topics  . Alcohol use: No    Family History  Problem Relation Age of Onset  . Pancreatic cancer Mother   . Heart attack Father   . Colon cancer Sister     Prior  to Admission medications   Medication Sig Start Date End Date Taking? Authorizing Provider  acetaminophen (TYLENOL) 500 MG tablet Take 1 tablet by mouth every 6 (six) hours as needed for mild pain or fever.    Yes [provider]  aspirin EC 325 MG tablet Take 325 tablets by mouth every 6 (six) hours as needed for mild pain.    Yes [provider]   furosemide (LASIX) 40 MG tablet Take 1 tablet (40 mg total) by mouth daily. 08/12/16  Yes Fritzi Mandes, MD  glipiZIDE (GLUCOTROL) 5 MG tablet Take 1 tablet by mouth 2 (two) times daily. 06/17/14  Yes [provider]  HUMULIN 70/30 KWIKPEN (70-30) 100 UNIT/ML PEN Inject 16-18 Units into the skin 2 (two) times daily. 18 units in the morning 16 units in the evening. 08/31/14  Yes [provider]  metoprolol tartrate (LOPRESSOR) 25 MG tablet Take 12.5 mg by mouth 2 (two) times daily.  08/31/14  Yes [provider]  PARoxetine (PAXIL) 20 MG tablet Take 20 mg by mouth daily.  08/31/14  Yes [provider]  Simethicone (GAS RELIEF PO) Take 1 tablet by mouth daily as needed (gas).  09/17/14  Yes [provider]    Allergies  Allergen Reactions  . Lisinopril Other (See Comments)    Causes weakness.  . Morphine And Related Other (See Comments)    Hallucinations    Current Facility-Administered Medications:  .   stroke: mapping our early stages of recovery book, , Does not apply, Once, Amie Portland, MD, Stopped at 04/25/17 1216 .  acetaminophen (TYLENOL) tablet 650 mg, 650 mg, Oral, Q4H PRN **OR** acetaminophen (TYLENOL) solution 650 mg, 650 mg, Per Tube, Q4H PRN **OR** acetaminophen (TYLENOL) suppository 650 mg, 650 mg, Rectal, Q4H PRN, Amie Portland, MD, 650 mg at 04/27/17 0457 .  chlorhexidine (PERIDEX) 0.12 % solution 15 mL, 15 mL, Mouth Rinse, BID, Sethi, Pramod S, MD .  insulin aspart (novoLOG) injection 0-15 Units, 0-15 Units, Subcutaneous, TID WC, Amie Portland, MD, 2 Units at 04/26/17 1805 .  MEDLINE mouth rinse, 15 mL, Mouth Rinse, q12n4p, Sethi, Pramod S, MD .  nicardipine (CARDENE) 20mg  in 0.86% saline 217ml IV infusion (0.1 mg/ml), 3-15 mg/hr, Intravenous, Continuous, Amie Portland, MD, Last Rate: 60 mL/hr at 04/27/17 0600, 6 mg/hr at 04/27/17 0600 .  senna-docusate (Senokot-S) tablet 1 tablet, 1 tablet, Oral, QHS PRN, Amie Portland, MD .  sodium  chloride (hypertonic) 3 % solution, , Intravenous, Continuous, Amie Portland, MD, Last Rate: 75 mL/hr at 04/27/17 0600   OBJECTIVE:   VITAL SIGNS: BP 115/70   Pulse (!) 108   Temp 99.1 F (37.3 C) (Axillary)   Resp (!) 27   Ht 5\' 8"  (1.727 m)   Wt 99.6 kg (219 lb 9.3 oz)   SpO2 94%   BMI 33.39 kg/m  Vitals:   04/27/17 0400 04/27/17 0500 04/27/17 0600 04/27/17 0700  BP: 136/69 124/74 134/65 115/70  Pulse: (!) 118 (!) 113 (!) 121 (!) 108  Resp: (!) 26 19 (!) 25 (!) 27  Temp:      TempSrc:      SpO2: 98% 96% 95% 94%  Weight:      Height:        INTAKE / OUTPUT: I/O last 3 completed shifts: In: 3828.8 [I.V.:3828.8] Out: 3700 [Urine:3700]   PHYSICAL EXAMINATION: General: Obese elderly female in no acute distress Neuro: Alert, oriented, R gaze preference. R-sided facial droop, twitches in L toes but otherwise dense L  hemiparesis. L Babinksi. HEENT: Normocephalic, atraumatic, no JVD, PERRL Cardiovascular: Irregularly irregular, rate controlled, no murmurs rubs gallops Lungs: Clear bilateral breath sounds Abdomen: Soft, nontender, nondistended Musculoskeletal: No acute deformity Skin: Grossly intact   LABS:  BMET Recent Labs  Lab 05/09/2017 2217 05/01/2017 2227 04/25/17 0142 04/26/17 0249  04/26/17 1606 04/26/17 2032 04/27/17 0221  NA 137 140  --  142   < > 148* 150* 152*  K 4.6 4.6  --  4.4  --   --   --   --   CL 104 105  --  107  --   --   --   --   CO2 22  --   --  26  --   --   --   --   BUN 29* 38*  --  18  --   --   --   --   CREATININE 1.15* 1.00 1.14* 1.13*  --   --   --   --   GLUCOSE 177* 177*  --  177*  --   --   --   --    < > = values in this interval not displayed.    Electrolytes Recent Labs  Lab 04/28/2017 2217 04/26/17 0249  CALCIUM 8.6* 9.1    CBC Recent Labs  Lab 04/23/2017 2217 04/19/2017 2227 04/25/17 0142 04/26/17 0249  WBC 7.3  --  7.6 8.3  HGB 13.0 14.6 12.8 12.8  HCT 43.3 43.0 42.1 42.6  PLT 177  --  177 185     Coag's Recent Labs  Lab 05/17/2017 2217  APTT 26  INR 1.07    Sepsis Markers No results for input(s): LATICACIDVEN, PROCALCITON, O2SATVEN in the last 168 hours.  ABG Recent Labs  Lab 04/25/17 0029  PHART 7.399  PCO2ART 45.6  PO2ART 84.0    Liver Enzymes Recent Labs  Lab 05/01/2017 2217  AST 30  ALT 15  ALKPHOS 88  BILITOT 1.1  ALBUMIN 3.6    Cardiac Enzymes No results for input(s): TROPONINI, PROBNP in the last 168 hours.  Glucose Recent Labs  Lab 04/25/17 1633 04/25/17 2128 04/26/17 0728 04/26/17 1241 04/26/17 1741 04/26/17 2120  GLUCAP 115* 147* 167* 152* 137* 141*    Imaging No results found.  CULTURES: Results for orders placed or performed during the hospital encounter of 05/16/2017  MRSA PCR Screening     Status: None   Collection Time: 04/25/17  3:15 PM  Result Value Ref Range Status   MRSA by PCR NEGATIVE NEGATIVE Final    Comment:        The GeneXpert MRSA Assay (FDA approved for NASAL specimens only), is one component of a comprehensive MRSA colonization surveillance program. It is not intended to diagnose MRSA infection nor to guide or monitor treatment for MRSA infections.     SIGNIFICANT EVENTS: 1/8 present with a CVA admitted to ICU. Started on 3% saline.   FAMILY  - sister (56) and 2 sons and 2 daughters updated.   Renee Pain, MD Board Certified by the ABIM, [place] Pager: [phone]  04/27/2017, 8:01 AM Cc: 45 min

## 2017-04-27 NOTE — Progress Notes (Signed)
Cortrak Tube Team Note:  Consult received to place a Cortrak feeding tube.   A 10 F Cortrak tube was placed in the L nare and secured with a nasal bridle at 82 cm. Per the Cortrak monitor reading the tube tip is in the stomach.   No x-ray is required. RN may begin using tube.   If the tube becomes dislodged please keep the tube and contact the Cortrak team at [email or web address] ([email or web address]) for replacement.  If after hours and replacement cannot be delayed, place a NG tube and confirm placement with an abdominal x-ray.    [place], [place], [place] Pager [phone] After Hours Pager

## 2017-04-27 NOTE — Progress Notes (Addendum)
Initial Nutrition Assessment  DOCUMENTATION CODES:   Obesity unspecified  INTERVENTION:    Initiate Jevity 1.2 at goal rate of 65 ml/h (1560 ml per day) and Prostat 30 ml daily  Provides 1972 kcals, 101 gm protein, 1259 ml free water daily  NUTRITION DIAGNOSIS:   Inadequate oral intake related to (lethargy, s/p acute R MCA infarct) as evidenced by NPO status  GOAL:   Patient will meet greater than or equal to 90% of their needs  MONITOR:   TF tolerance, Labs, Weight trends, I & O's, Skin  REASON FOR ASSESSMENT:   Consult Enteral/tube feeding initiation and management  ASSESSMENT:   84 year old female admitted with right MCA CVA, now with hemorrhagic transformation measuring 3.4 cm x 2.6 cm in the area of the right MCA infarct, totaling about 9 cc causing about a 7.7 mm midline shift to the left as well. Effacement of the right lateral ventricle. No hydrocephalus.    Pt is lethargic. Well nourished. No nutrition problems PTA. Speech Path following. Pt at severe aspiration risk. Cortrak feeding tube in place. Tip is in stomach. Labs and medications reviewed. Na 154 (H). CBG's [id number].  NUTRITION - FOCUSED PHYSICAL EXAM:  Completed. No muscle or fat depletion.  Diet Order:  Diet NPO time specified  EDUCATION NEEDS:   No education needs have been identified at this time  Skin:  Skin Assessment: Reviewed RN Assessment  Last BM:  PTA   Intake/Output Summary (Last 24 hours) at 04/27/2017 1553 Last data filed at 04/27/2017 1300 Gross per 24 hour  Intake 2037.5 ml  Output 1650 ml  Net 387.5 ml   Height:   Ht Readings from Last 1 Encounters:  04/25/17 5\' 8"  (1.727 m)    Weight:   Wt Readings from Last 1 Encounters:  04/25/17 219 lb 9.3 oz (99.6 kg)    Ideal Body Weight:  63.6 kg  BMI:  Body mass index is 33.39 kg/m.  Estimated Nutritional Needs:   Kcal:  1800-2000  Protein:  95-110 gm  Fluid:  1.8-2.0 L  Arthur Holms, RD, LDN Pager  #: [phone] After-Hours Pager #: [phone]

## 2017-04-27 NOTE — Progress Notes (Signed)
NEUROHOSPITALISTS STROKE TEAM - DAILY PROGRESS NOTE   ADMISSION HISTORY: Pamela Chaney is a 84 y.o. female past medical history of A. fib not on anticoagulant, diabetes, hypertension, hyperlipidemia, congestive heart failure, who is brought in as a code stroke from home [facility].  Initial report said last known normal 6 PM when she was noted to have left-sided facial droop, slurred speech and left-sided weakness.  Upon family arrival, they report that her last definitive seen normal was somewhere around 2 PM to 6 PM is when she was found to be weak on the left side.  She has a history of atrial fibrillation, not on anticoagulation as she discontinued her apixaban because of "wheezing problems with that medication".  She is currently not on anticoagulation.  She has had a stroke in the past based on imaging but she has never had any symptoms related to her stroke. She has a history of congestive heart failure, for which she follows up with cardiology. The [facility], systolic blood pressure in the 250s.  The report complete left-sided neglect, left hemiplegia and right gaze deviation along with the facial droop. Upon initial evaluation in the emergency room, was consistent with complete right MCA syndrome. Noncontrast CT of the head showed hypodensity in majority of the right MCA territory with some petechial bleeding as well along with a poor ASPECTS score of 3.  There was some delay in obtaining IV access and obtaining CTA and perfusion.  CT and perfusion showed a right M1 occlusion with a mismatch of 51 cc.  Case was discussed with neuro interventional specialist and based on the poor aspect score and reasonable core, decided to not be a candidate for intervention. She was past the window for IV TPA upon presentation. At baseline, walks with a cane, drives car, lives independently, performs ADLs  independently.  LKW: 2 PM on 04/25/2017 tpa given?: no, outside the window Premorbid modified Rankin scale (mRS): 2 NIHSS - 20  SUBJECTIVE (INTERVAL HISTORY) No family is at the bedside. Patient is found laying in bed in NAD. Neuro exam is unchanged.   Patient clinically remains unchanged. Serum sodium is 154 this a.m.   OBJECTIVE Lab Results: CBC:  Recent Labs  Lab 04/20/2017 2217 04/20/2017 2227 04/25/17 0142 04/26/17 0249  WBC 7.3  --  7.6 8.3  HGB 13.0 14.6 12.8 12.8  HCT 43.3 43.0 42.1 42.6  MCV 94.5  --  93.8 95.9  PLT 177  --  177 185   BMP: Recent Labs  Lab 04/22/2017 2217 05/14/2017 2227 04/25/17 0142 04/26/17 0249  04/26/17 1606 04/26/17 2032 04/27/17 0221 04/27/17 0740 04/27/17 1426  NA 137 140  --  142   < > 148* 150* 152* 154* 156*  K 4.6 4.6  --  4.4  --   --   --   --   --   --   CL 104 105  --  107  --   --   --   --   --   --   CO2 22  --   --  26  --   --   --   --   --   --  GLUCOSE 177* 177*  --  177*  --   --   --   --   --   --   BUN 29* 38*  --  18  --   --   --   --   --   --   CREATININE 1.15* 1.00 1.14* 1.13*  --   --   --   --   --   --   CALCIUM 8.6*  --   --  9.1  --   --   --   --   --   --    < > = values in this interval not displayed.   PHYSICAL EXAM Temp:  [98.1 F (36.7 C)-100.5 F (38.1 C)] 100.5 F (38.1 C) (01/11 1700) Pulse Rate:  [30-160] 160 (01/11 1700) Resp:  [15-42] 23 (01/11 1700) BP: (115-162)/(56-100) 141/100 (01/11 1700) SpO2:  [93 %-99 %] 96 % (01/11 1700) General - Well nourished, well developed, in no apparent distress HEENT-  Normocephalic, Normal external eye/conjunctiva.  Normal external ears. Normal external nose, mucus membranes and septum.   Cardiovascular - Regular rate and rhythm  Respiratory - Lungs clear bilaterally. No wheezing. Abdomen - soft and non-tender, BS normal Extremities- no edema or cyanosis NEURO:  Mental Status: drowsy but arouses easilyLanguage: speech is severely dysarthric.  Naming,  repetition, fluency, and comprehension intact. Cranial Nerves: PERRL. EOM restricted with rightward gaze preference and inability to cross the midline to the left,, visual fields exam shows left homonymous hemianopsia, complete left lower facial weakness, decreased sensation on the left face, hearing intact, tongue/uvula/soft palate midline, normal sternocleidomastoid and trapezius muscle strength. No evidence of tongue atrophy or fibrillations Motor: left dense hemiplegia 0/5 left upper, 0/5 left lower, 5/5 right upper, 5/5 right lower.  Normal tone normal range of motion in the right side, flaccid left upper and lower extremity. Sensation-does not into the left side of her body.  Completely lost sensation to noxious edematous on the left side of the body.  Unable to recognize her left hand when shown. Coordination: FTN intact on the right.  Unable to perform the left Gait- deferred  IMAGING: I have personally reviewed the radiological images below and agree with the radiology interpretations. Ct Angio Head and Neck W Or Wo Contrast and Perfusion Result Date: 05/07/2017  IMPRESSION: 1. Occlusion of the right middle cerebral artery at the M1/M2 junction with complete loss of opacification of the right M2 superior division. 2. Right middle cerebral artery distribution infarction with ischemic penumbra volume measuring 51 mL, primarily concentrated in the posterior zone of the right MCA distribution. 3. Redemonstration of petechial hemorrhage in the distribution of the infarct, unchanged. Small focus of possible subarachnoid blood in the anterior interhemispheric fissure is also unchanged. 4. No hemodynamically significant stenosis of the carotid or vertebral arteries. 5.  Aortic Atherosclerosis (ICD10-I70.0). Critical Value/emergent results were called by telephone at the time of interpretation on 05/08/2017 at 11:53 pm to Dr. Amie Portland , who verbally acknowledged these results. Electronically Signed   By:  Ulyses Jarred M.D.   On: 04/27/2017 23:54   Mr Brain Wo Contrast Result Date: 04/25/2017 IMPRESSION: Large territory acute infarct right MCA territory with petechial hemorrhage. Local mass-effect and mild midline shift to the left. Electronically Signed   By: Franchot Gallo M.D.   On: 04/25/2017 08:27   Dg Chest Portable 1 View Result Date: 04/25/2017 IMPRESSION: Mild pulmonary edema with small left pleural effusion. Unchanged cardiomegaly. Electronically Signed  By: Ulyses Jarred M.D.   On: 04/25/2017 01:03   Ct Head Code Stroke Wo Contrast Result Date: 05/03/2017 IMPRESSION: 1. Posterior right MCA territory acute infarction with areas of petechial hemorrhage measuring up to 6.5 mm. No associated midline shift. 2. ASPECTS is 3. 3. Slight increase in size of partially calcified right frontal convexity meningioma without edema or other focal abnormality of the underlying brain. Critical Value/emergent results were called by telephone at the time of interpretation on 05/13/2017 at 10:57 pm to Dr. Rory Percy , who verbally acknowledged these results. Electronically Signed   By: Ulyses Jarred M.D.   On: 04/20/2017 23:01   Echocardiogram:                                              Left ventricle: The cavity size was normal. Wall thickness was   increased increased in a pattern of mild to moderate LVH.   Systolic function was normal. The estimated ejection fraction was   in the range of 60% to 65% Repeat CT Head    04/25/2016                                           1. Hemorrhagic transformation of right middle cerebral artery territory infarction, with new intraparenchymal hematoma measuring 9 mL. 2. Marked worsening of mass effect with 8 mm of leftward midline shift and effacement of the right lateral ventricle.    IMPRESSION: Pamela Chaney is a 84 y.o. female with PMH of  A. fib not on anticoagulant, diabetes, hypertension, hyperlipidemia, congestive heart failure, presenting as acute code stroke  with left facial droop, slurred speech and left-sided arm and leg flaccid weakness. Most likely a cardio embolic stroke in the setting of atrial fibrillation not on anticoagulation. Initially thought to be close to the 4-1/2-hour window since last seen normal but more history obtained from the family put her outside the window for the last known normal at 2 PM. Because of the poor aspects and a petechial bleed noticed on the noncontrast CT, not a candidate for acute intervention. Discussed findings in detail with the family, who understand the current clinical condition she is in and the rationale of therapy going forward. She also presented severely hypertensive, in A. fib with RVR, responded appropriately to metoprolol and is requiring O2 supplementation for maintaining sats. MRI reveals:  Large territory acute infarct right MCA territory with petechial hemorrhage and hemorrhagic transformation  Suspected Etiology: Likely cardio embolic from AFIB. Resultant Symptoms:  left facial droop, slurred speech and left-sided arm and leg flaccid weakness. Stroke Risk Factors: atrial fibrillation, hyperlipidemia and hypertension Other Stroke Risk Factors: Advanced age, Obesity, Body mass index is 33.39 kg/m. , CHF                                                             04/27/2017 ASSESSMENT:   Neuro exam - no worsening, unchanged with Left sided deficits and dysarthria. CT head 04/25/16 p.m. shows hemorrhagic transformation with local mass-effect and 7 mm midline shift to the  left. . Patient started on hypertonic saline. PLAN  04/27/2017: HOLD ASA DUE to hemorrhagic transformation  Frequent neuro checks Telemetry monitoring PT/OT/SLP Consult PM & Rehab Consult Case Management /MSW Ongoing aggressive stroke risk factor management Patient will be counseled to be compliant with her antithrombotic medications Patient will be counseled on Lifestyle modifications including, Diet, Exercise, and  Stress Follow up with [facility] in 6 weeks  DYSPHAGIA: NPO until passes SLP swallow evaluation Aspiration Precautions in progress  CEREBRAL EDEMA:  On hypertonic saline. Serum sodium 154 today. MEDICAL ISSUES: Appreciate PCCM consult for assistance with medical management while in the ICU  AFIB with RVR EKG shows A. Fib Continue Metoprolol for Rate control as needed Elevated Troponin's and BNP Cardiology consultation in AM Continue to HOLD [facility] - Will need long-term anticoagulation -decision on timing of starting to correlation based on clinical course and stroke size on MRI.   RESP Watch respiratory status closely for decompensation. Patient is requiring O2 supplementation for maintaining sats.  CKD Gentle IV hydration Repeat Labs in AM  Congestive heart failure Mild Pulmonary edema on admission CXR TTE pending.  May consider cards consult after test results.  HYPERTENSION: Stable Permissive hypertension - SBP greater than 180 for 24-48 hours post stroke and then gradually  Will start patient on Nicardipine drip, Labetolol PRN Long term BP goal normotensive. May slowly restart home B/P medications after 48 hours Home Meds: Metoprolol  HYPERLIPIDEMIA:    Component Value Date/Time   CHOL 227 (H) 04/25/2017 0353   TRIG 84 04/25/2017 0353   HDL 38 (L) 04/25/2017 0353   CHOLHDL 6.0 04/25/2017 0353   VLDL 17 04/25/2017 0353   LDLCALC 172 (H) 04/25/2017 0353  Home Meds:  NONE LDL  goal < 70 Will start on Lipitor to 80 mg daily, once medically stable Will continue statin at discharge  DIABETES: Lab Results  Component Value Date   HGBA1C 6.6 (H) 04/25/2017  HgbA1c goal < 7.0 Currently on: Novolog Continue CBG monitoring and SSI to maintain glucose 140-180 mg/dl DM education   OBESITY Obesity, Body mass index is 33.39 kg/m. Greater than/equal to 30  Other Active Problems: Active Problems:   CVA (cerebral vascular accident) [facility])   Atrial  fibrillation with RVR ([facility])   Hypertensive crisis   Hypertensive emergency   Palliative care encounter    Hospital day # 2 VTE prophylaxis: SCD's  Diet : Diet NPO time specified   FAMILY UPDATES: family at bedside  TEAM UPDATES: Garvin Fila, MD STATUS:  She is a DNR at this time but not a DNI.   Prior Home Stroke Medications:  None - per notes she stopped taking AC without medical advice  Discharge Stroke Meds:  Please discharge patient on TBD   Disposition: 06-Home-Health Care Svc Therapy Recs:               PENDING Home Equipment:         PENDING Follow Up:  Follow-up Information    Garvin Fila, MD. Schedule an appointment as soon as possible for a visit in 6 week(s).   Specialties:  Neurology, Radiology Contact information: [address] [place] [address] [phone]          Idelle Crouch, MD -PCP Follow up in 1-2 weeks      I have personally examined this patient, reviewed notes, independently viewed imaging studies, participated in medical decision making and plan of care.ROS completed by me personally and pertinent  positives fully documented  I have made any additions or clarifications directly to the above note.    She has presented with a large right middle cerebral artery infarct likely from atrial fibrillation and is at risk for neurological worsening with development of cerebral edema and hemorrhagic transformation.  Family has agreed to DO NOT RESUSCITATE but needs more time to think about palliative care and goals of care. They're agreeable to meet with the palliative care team to decide goals of care. Continue hypertonic saline through peripheral IV for now.  Hold hypertonic saline drip and continue to follow sodium. Insert cortrack tube for  feeding. Discussed with Dr. Carson Myrtle  This patient is critically ill and at significant risk of neurological worsening, death and care requires constant monitoring of vital signs,  hemodynamics,respiratory and cardiac monitoring, extensive review of multiple databases, frequent neurological assessment, discussion with family, other specialists and medical decision making of high complexity.I have made any additions or clarifications directly to the above note.This critical care time does not reflect procedure time, or teaching time or supervisory time of PA/NP/Med Resident etc but could involve care discussion time.  I spent 30 minutes of neurocritical care time  in the care of  this patient.      Antony Contras, MD Medical Director [facility] Pager: [phone] 04/27/2017 5:15 PM  To contact Stroke Continuity provider, please refer to [email or web address] After hours, contact General Neurology

## 2017-04-27 NOTE — Evaluation (Signed)
Physical Therapy Evaluation Patient Details Name: Pamela Chaney MRN: [id number] DOB: 1926/02/16 Today's Date: 04/27/2017   History of Present Illness  84 year old woman admitted with a right MCA stroke likely secondary to her Afib, now with hemorrhagic transformation causing mass-effect on the lateral ventricle as well as midline shift. PMHx: Afib, CHF, colon CA, DM, HTN, neuropathy, CAD  Clinical Impression  Pt lethargic with eyes closed on nonresponsive on arrival, with initiation of movement of RLE pt able to open eyes and verbalize. Pt able to state she has a [employer], 3 boys/1girl, had a stroke, and is tired. Pt with right gaze no movement of left side with impaired balance, cognition, strength, function and transfers who will benefit from acute therapy to maximize mobility, function, balance and cognition to decrease burden of care. Daughter reports she could move in with pt if pt were to achieve increased functional mobility.     Follow Up Recommendations SNF;Supervision/Assistance - 24 hour    Equipment Recommendations  Wheelchair cushion (measurements PT);Wheelchair (measurements PT);Hospital bed    Recommendations for Other Services       Precautions / Restrictions Precautions Precautions: Fall Precaution Comments: left HP, right gaze, left neglect Restrictions Weight Bearing Restrictions: No      Mobility  Bed Mobility Overal bed mobility: Needs Assistance Bed Mobility: Supine to Sit;Sit to Supine     Supine to sit: Total assist;+2 for physical assistance Sit to supine: Total assist;+2 for physical assistance   General bed mobility comments: Pt able to flex R knee with max assist initially with cues for sequence rolling and partial side to sit. Pt with difficulty with motor commands and unable to significantly assist with transfer  Transfers Overall transfer level: Needs assistance Equipment used: 2 person hand held assist Transfers: Sit to/from Stand Sit  to Stand: Total assist;+2 physical assistance         General transfer comment: no activation of L affected side; forward head; no initiation of movement/forward weight shift, knee blocked with assist of belt and pad to rise unable to achieve fully upright  Ambulation/Gait             General Gait Details: unable  Stairs            Wheelchair Mobility    Modified Rankin (Stroke Patients Only) Modified Rankin (Stroke Patients Only) Pre-Morbid Rankin Score: No symptoms Modified Rankin: Severe disability     Balance Overall balance assessment: Needs assistance   Sitting balance-Leahy Scale: Zero Sitting balance - Comments: L posterior bias with mod-max assist to maintain with verbal and tactile cues. Not assisting with RUE on rail for balance Postural control: Posterior lean;Left lateral lean   Standing balance-Leahy Scale: Zero                               Pertinent Vitals/Pain Pain Assessment: No/denies pain Faces Pain Scale: No hurt    Home Living Family/patient expects to be discharged to:: Unsure Living Arrangements: Alone Available Help at Discharge: Family;Available 24 hours/day Type of Home: House Home Access: Stairs to enter Entrance Stairs-Rails: Can reach both Entrance Stairs-Number of Steps: 3 Home Layout: One level Home Equipment: Walker - 4 wheels;Cane - single point      Prior Function Level of Independence: Independent with assistive device(s)         Comments: Pt reports she mostly uses the cane in the home, rollator when she needs it and when out  of the house.Cart in grocery store; drove a [employer]; weekly hair appts     Hand Dominance   Dominant Hand: Right    Extremity/Trunk Assessment   Upper Extremity Assessment Upper Extremity Assessment: Defer to OT evaluation    Lower Extremity Assessment Lower Extremity Assessment: RLE deficits/detail;LLE deficits/detail RLE Deficits / Details: pt with tendency  for extension with resistance into flexion to roll, able to maintain knee flexion EOB LLE Deficits / Details: no AROM, pt unable to state sensation to light touch or pain    Cervical / Trunk Assessment Cervical / Trunk Assessment: Kyphotic;Other exceptions Cervical / Trunk Exceptions: Forward head; posterior lean and L bias; unable to achieve midline postural control  Communication   Communication: Expressive difficulties(dysarthric)  Cognition Arousal/Alertness: Lethargic Behavior During Therapy: Flat affect Overall Cognitive Status: Impaired/Different from baseline Area of Impairment: Attention;Memory;Following commands;Safety/judgement;Awareness;Problem solving                   Current Attention Level: Focused Memory: Decreased short-term memory Following Commands: Follows one step commands inconsistently;Follows one step commands with increased time Safety/Judgement: Decreased awareness of safety;Decreased awareness of deficits Awareness: Intellectual Problem Solving: Slow processing;Decreased initiation;Difficulty sequencing;Requires verbal cues;Requires tactile cues        General Comments      Exercises Other Exercises Other Exercises: Educated daughter on LUE positioning and controlling L hand edema Other Exercises: Educated on increasing awareness to L side; hand over hand to rub lotion on LUE   Assessment/Plan    PT Assessment Patient needs continued PT services  PT Problem List Decreased strength;Decreased mobility;Decreased safety awareness;Impaired tone;Decreased range of motion;Decreased coordination;Decreased knowledge of precautions;Obesity;Decreased activity tolerance;Decreased cognition;Cardiopulmonary status limiting activity;Decreased balance;Decreased knowledge of use of DME;Impaired sensation       PT Treatment Interventions Therapeutic exercise;Patient/family education;Balance training;Functional mobility training;Neuromuscular re-education;DME  instruction;Therapeutic activities;Cognitive remediation    PT Goals (Current goals can be found in the Care Plan section)  Acute Rehab PT Goals Patient Stated Goal: Per family - to have a quality of life PT Goal Formulation: With patient/family Time For Goal Achievement: 05/11/17 Potential to Achieve Goals: Fair    Frequency Min 3X/week   Barriers to discharge Decreased caregiver support      Co-evaluation PT/OT/SLP Co-Evaluation/Treatment: Yes Reason for Co-Treatment: Complexity of the patient's impairments (multi-system involvement);Necessary to address cognition/behavior during functional activity;For patient/therapist safety;To address functional/ADL transfers PT goals addressed during session: Mobility/safety with mobility;Balance OT goals addressed during session: ADL's and self-care;Strengthening/ROM       AM-PAC PT "6 Clicks" Daily Activity  Outcome Measure Difficulty turning over in bed (including adjusting bedclothes, sheets and blankets)?: Unable Difficulty moving from lying on back to sitting on the side of the bed? : Unable Difficulty sitting down on and standing up from a chair with arms (e.g., wheelchair, bedside commode, etc,.)?: Unable Help needed moving to and from a bed to chair (including a wheelchair)?: Total Help needed walking in hospital room?: Total Help needed climbing 3-5 steps with a railing? : Total 6 Click Score: 6    End of Session Equipment Utilized During Treatment: Gait belt Activity Tolerance: Patient tolerated treatment well Patient left: in bed;with call bell/phone within reach;with nursing/sitter in room;with family/visitor present;with bed alarm set Nurse Communication: Need for lift equipment;Mobility status PT Visit Diagnosis: Other abnormalities of gait and mobility (R26.89);Other symptoms and signs involving the nervous system (R29.898);Hemiplegia and hemiparesis;Muscle weakness (generalized) (M62.81);Difficulty in walking, not  elsewhere classified (R26.2);Unsteadiness on feet (R26.81) Hemiplegia - Right/Left: Left Hemiplegia -  dominant/non-dominant: Non-dominant Hemiplegia - caused by: Cerebral infarction;Other Nontraumatic intracranial hemorrhage    Time: 8485-9276 PT Time Calculation (min) (ACUTE ONLY): 34 min   Charges:   PT Evaluation $PT Eval Moderate Complexity: 1 Mod     PT G Codes:        Elwyn Reach, PT [phone]   [place] B Kasim Mccorkle 04/27/2017, 12:07 PM

## 2017-04-27 NOTE — Progress Notes (Signed)
SLP Cancellation Note  Patient Details Name: CYNTHEA ZACHMAN MRN: [id number] DOB: March 18, 1926   Cancelled treatment:       Reason Eval/Treat Not Completed: Fatigue/lethargy limiting ability to participate  Patient unable to rouse to safely participate in re assessment of her swallowing.  ST will continue efforts.  Shelly Flatten, MA, [facility] SLP [phone]  Lamar Sprinkles 04/27/2017, 11:23 AM

## 2017-04-27 NOTE — Progress Notes (Signed)
Occupational Therapy Evaluation Patient Details Name: Pamela Chaney MRN: [id number] DOB: [birth year] Today's Date: 04/27/2017    History of Present Illness 84 year old woman admitted with a right MCA stroke likely secondary to her Afib, now with hemorrhagic transformation causing mass-effect on the lateral ventricle as well as midline shift. PMHx: Afib, CHF, colon CA, DM, HTN, neuropathy, CAD   Clinical Impression   PTA, pt lived alone and was modified independent with ADL and mobility @ cane level, including driving. Pt presents with a significant functional decline due to below listed deficits and currently requires Total A +2 for mobility and total A with ADL. While sittingEOB, level of arousal did improve with pt following commands, although perseverating. Pt answering questions and knew she was at [facility] and had a stroke. 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 Fatigued easily, became less responsive and returned to supine; HR increased to 144. Pt with apparent L neglect and L visual field cut. At this time, recommend rehab at [facility]. If level of arousal improves, pt may be appropriate for CIR, but will require 24/7 physical A after DC. Will follow acutely to address established goals.     Follow Up Recommendations  SNF;Supervision/Assistance - 24 hour    Equipment Recommendations  Other (comment)(TBA)    Recommendations for Other Services       Precautions / Restrictions Precautions Precautions: Fall Precaution Comments: left HP, right gaze, left neglect Restrictions Weight Bearing Restrictions: No      Mobility Bed Mobility Overal bed mobility: Needs Assistance Bed Mobility: Supine to Sit;Sit to Supine     Supine to sit: Total assist;+2 for physical assistance Sit to supine: Total assist;+2 for physical assistance   General bed mobility comments: Pt able to flex R knee but difficulty maintianing and following through with  motor command - ? motor impersistance  Transfers Overall transfer level: Needs assistance Equipment used: 2 person hand held assist Transfers: Sit to/from Stand Sit to Stand: Total assist;+2 physical assistance         General transfer comment: no activation of L affected side; forward head; no initiation of movement/forward weight shift    Balance Overall balance assessment: Needs assistance   Sitting balance-Leahy Scale: Zero Sitting balance - Comments: L bias Postural control: Posterior lean;Left lateral lean   Standing balance-Leahy Scale: Zero                             ADL either performed or assessed with clinical judgement   ADL Overall ADL's : Needs assistance/impaired     Grooming: Maximal assistance Grooming Details (indicate cue type and reason): Pt appropriately brought comb to hair/perseverates; washcloth briefly to face; impacted by level of arousal                             Functional mobility during ADLs: Total assistance;+2 for physical assistance General ADL Comments: Total A for ADL with exception of grooming; L neglect noted; ADL impacted by perceptual deficits in addition to level of arousal     Vision Baseline Vision/History: Wears glasses Wears Glasses: At all times Patient Visual Report: (vision apparently) Vision Assessment?: Vision impaired- to be further tested in functional context Additional Comments: Apparent L field cut; no blink to threat on L side; R gaze preference; able to achieve midlien gaze for very brief periods  of seconds     Perception Perception Perception Tested?: Yes Perception Deficits: Inattention/neglect;Spatial orientation Inattention/Neglect: Does not attend to left visual field;Does not attend to left side of body;Impaired- to be further tested in functional context Comments: will further assess   Praxis Praxis Praxis tested?: Deficits Deficits: Perseveration    Pertinent Vitals/Pain  Pain Assessment: No/denies pain Faces Pain Scale: No hurt     Hand Dominance Right   Extremity/Trunk Assessment Upper Extremity Assessment Upper Extremity Assessment: LUE deficits/detail LUE Deficits / Details: Falccid; no movement noted; no resonse to pain LUE Sensation: decreased light touch;decreased proprioception LUE Coordination: decreased fine motor;decreased gross motor   Lower Extremity Assessment Lower Extremity Assessment: Defer to PT evaluation   Cervical / Trunk Assessment Cervical / Trunk Assessment: Kyphotic;Other exceptions Cervical / Trunk Exceptions: Forward head; posterior lean and L bias; unable to achieve midline postural control   Communication Communication Communication: Expressive difficulties(dysarthric)   Cognition Arousal/Alertness: Lethargic Behavior During Therapy: Flat affect Overall Cognitive Status: Impaired/Different from baseline Area of Impairment: Attention;Memory;Following commands;Safety/judgement;Awareness;Problem solving                   Current Attention Level: Focused Memory: Decreased short-term memory Following Commands: Follows one step commands inconsistently;Follows one step commands with increased time Safety/Judgement: Decreased awareness of safety;Decreased awareness of deficits Awareness: Intellectual Problem Solving: Slow processing;Decreased initiation;Difficulty sequencing;Requires verbal cues;Requires tactile cues     General Comments       Exercises Exercises: Other exercises Other Exercises Other Exercises: Educated daughter on LUE positioning and controlling L hand edema Other Exercises: Educated on increasing awareness to L side; hand over hand to rub lotion on LUE   Shoulder Instructions      Home Living Family/patient expects to be discharged to:: Unsure Living Arrangements: Alone Available Help at Discharge: Family;Available 24 hours/day Type of Home: House Home Access: Stairs to enter [employer] of Steps: 3 Entrance Stairs-Rails: Can reach both Home Layout: One level               Home Equipment: Walker - 4 wheels;Cane - single point          Prior Functioning/Environment Level of Independence: Independent with assistive device(s)        Comments: Pt reports she mostly uses the cane in the home, rollator when she needs it and when out of the house.Cart in grocery store; drove a [place]; weely hair appts        OT Problem List: Decreased strength;Decreased range of motion;Decreased activity tolerance;Impaired balance (sitting and/or standing);Impaired vision/perception;Decreased coordination;Decreased cognition;Decreased safety awareness;Decreased knowledge of use of DME or AE;Cardiopulmonary status limiting activity;Impaired sensation;Impaired tone;Obesity;Impaired UE functional use;Increased edema      OT Treatment/Interventions: Self-care/ADL training;Therapeutic exercise;Neuromuscular education;DME and/or AE instruction;Splinting;Therapeutic activities;Cognitive remediation/compensation;Visual/perceptual remediation/compensation;Patient/family education;Balance training    OT Goals(Current goals can be found in the care plan section) Acute Rehab OT Goals Patient Stated Goal: Per family - to have a quality of life OT Goal Formulation: With patient/family Time For Goal Achievement: 05/11/17 Potential to Achieve Goals: Good  OT Frequency: Min 2X/week   Barriers to D/C:            Co-evaluation PT/OT/SLP Co-Evaluation/Treatment: Yes Reason for Co-Treatment: Complexity of the patient's impairments (multi-system involvement);Necessary to address cognition/behavior during functional activity;For patient/therapist safety;To address functional/ADL transfers PT goals addressed during session: Mobility/safety with mobility;Balance OT goals addressed during session: ADL's and self-care;Strengthening/ROM      AM-PAC PT "6 Clicks" Daily Activity  Outcome Measure Help from another person eating meals?: Total Help from another person taking care of personal grooming?: Total Help from another person toileting, which includes using toliet, bedpan, or urinal?: Total Help from another person bathing (including washing, rinsing, drying)?: Total Help from another person to put on and taking off regular upper body clothing?: Total Help from another person to put on and taking off regular lower body clothing?: Total 6 Click Score: 6   End of Session Equipment Utilized During Treatment: Gait belt;Oxygen(#L [place]) Nurse Communication: Mobility status;Other (comment)(positioning)  Activity Tolerance: Patient limited by lethargy Patient left: in bed;with call bell/phone within reach;with bed alarm set;with family/visitor present;with SCD's reapplied  OT Visit Diagnosis: Other abnormalities of gait and mobility (R26.89);Muscle weakness (generalized) (M62.81);Low vision, both eyes (H54.2);Other symptoms and signs involving cognitive function;Hemiplegia and hemiparesis Hemiplegia - Right/Left: Left Hemiplegia - dominant/non-dominant: Non-Dominant Hemiplegia - caused by: Cerebral infarction;Nontraumatic intracerebral hemorrhage                Time: 1478-2956 OT Time Calculation (min): 35 min Charges:  OT General Charges $OT Visit: 1 Visit OT Evaluation $OT Eval Moderate Complexity: 1 Mod G-Codes:     [employer], OT/L  [phone] 04/27/2017  Ahnesty Finfrock,HILLARY 04/27/2017, 11:36 AM

## 2017-04-28 ENCOUNTER — Inpatient Hospital Stay (HOSPITAL_COMMUNITY): Payer: Medicare HMO

## 2017-04-28 DIAGNOSIS — Z515 Encounter for palliative care: Secondary | ICD-10-CM

## 2017-04-28 DIAGNOSIS — I63411 Cerebral infarction due to embolism of right middle cerebral artery: Principal | ICD-10-CM

## 2017-04-28 DIAGNOSIS — I6389 Other cerebral infarction: Secondary | ICD-10-CM

## 2017-04-28 DIAGNOSIS — G936 Cerebral edema: Secondary | ICD-10-CM

## 2017-04-28 LAB — GLUCOSE, CAPILLARY
Glucose-Capillary: 246 mg/dL — ABNORMAL HIGH (ref 65–99)
Glucose-Capillary: 265 mg/dL — ABNORMAL HIGH (ref 65–99)
Glucose-Capillary: 171 mg/dL — ABNORMAL HIGH (ref 65–99)

## 2017-04-28 LAB — MAGNESIUM: Magnesium: 2.3 mg/dL (ref 1.7–2.4)

## 2017-04-28 LAB — RENAL FUNCTION PANEL
Albumin: 3.2 g/dL — ABNORMAL LOW (ref 3.5–5.0)
Anion gap: 11 (ref 5–15)
BUN: 24 mg/dL — ABNORMAL HIGH (ref 6–20)
CHLORIDE: 125 mmol/L — AB (ref 101–111)
CO2: 22 mmol/L (ref 22–32)
CREATININE: 1.11 mg/dL — AB (ref 0.44–1.00)
Calcium: 8.7 mg/dL — ABNORMAL LOW (ref 8.9–10.3)
GFR calc non Af Amer: 43 mL/min — ABNORMAL LOW (ref >60)
GFR, EST AFRICAN AMERICAN: 50 mL/min — AB (ref >60)
Glucose, Bld: 274 mg/dL — ABNORMAL HIGH (ref 65–99)
POTASSIUM: 3.9 mmol/L (ref 3.5–5.1)
Phosphorus: 3 mg/dL (ref 2.5–4.6)
Sodium: 158 mmol/L — ABNORMAL HIGH (ref 135–145)

## 2017-04-28 LAB — CBC
HEMATOCRIT: 40.3 % (ref 36.0–46.0)
HEMOGLOBIN: 12 g/dL (ref 12.0–15.0)
MCH: 29.4 pg (ref 26.0–34.0)
MCHC: 29.8 g/dL — ABNORMAL LOW (ref 30.0–36.0)
MCV: 98.8 fL (ref 78.0–100.0)
PLATELETS: 153 10*3/uL (ref 150–400)
RBC: 4.08 MIL/uL (ref 3.87–5.11)
RDW: 15.4 % (ref 11.5–15.5)
WBC: 7.6 10*3/uL (ref 4.0–10.5)

## 2017-04-28 LAB — SODIUM
Sodium: 155 mmol/L — ABNORMAL HIGH (ref 135–145)
Sodium: 156 mmol/L — ABNORMAL HIGH (ref 135–145)

## 2017-04-28 MED ORDER — KETOROLAC TROMETHAMINE 15 MG/ML IJ SOLN: 15.0000 mg | Freq: Four times a day (QID) | INTRAMUSCULAR | Status: DC | PRN

## 2017-04-28 MED ORDER — MORPHINE BOLUS VIA INFUSION
2.0000 mg | INTRAVENOUS | Status: DC | PRN
  Administered 2017-04-28: 4 mg via INTRAVENOUS
  Administered 2017-04-28 (×2): 2 mg via INTRAVENOUS
  Filled 2017-04-28: qty 4

## 2017-04-28 MED ORDER — CLEVIDIPINE BUTYRATE 0.5 MG/ML IV EMUL
0.0000 mg/h | INTRAVENOUS | Status: DC
  Administered 2017-04-28: 1 mg/h via INTRAVENOUS
  Filled 2017-04-28: qty 50

## 2017-04-28 MED ORDER — LORAZEPAM 2 MG/ML IJ SOLN: 1.0000 mg | INTRAMUSCULAR | Status: DC

## 2017-04-28 MED ORDER — LORAZEPAM 2 MG/ML IJ SOLN
1.0000 mg | INTRAMUSCULAR | Status: DC | PRN
  Administered 2017-04-28: 2 mg via INTRAVENOUS
  Filled 2017-04-28: qty 1

## 2017-04-28 MED ORDER — GLYCOPYRROLATE 0.2 MG/ML IJ SOLN
0.2000 mg | INTRAMUSCULAR | Status: DC
  Administered 2017-04-28 – 2017-04-29 (×5): 0.2 mg via INTRAVENOUS
  Filled 2017-04-28 (×6): qty 1

## 2017-04-28 MED ORDER — GLYCOPYRROLATE 0.2 MG/ML IJ SOLN: 0.2000 mg | INTRAMUSCULAR | Status: DC | PRN

## 2017-04-28 MED ORDER — SODIUM CHLORIDE 0.9 % IV SOLN
1.0000 mg/h | INTRAVENOUS | Status: DC
  Administered 2017-04-28: 3 mg/h via INTRAVENOUS
  Filled 2017-04-28: qty 10

## 2017-04-28 MED ORDER — PANTOPRAZOLE SODIUM 40 MG IV SOLR
40.0000 mg | Freq: Every day | INTRAVENOUS | Status: DC
  Administered 2017-04-28: 40 mg via INTRAVENOUS
  Filled 2017-04-28: qty 40

## 2017-04-28 MED ORDER — LORAZEPAM 2 MG/ML IJ SOLN
2.0000 mg | INTRAMUSCULAR | Status: DC
  Administered 2017-04-28 – 2017-04-29 (×4): 2 mg via INTRAVENOUS
  Filled 2017-04-28 (×5): qty 1

## 2017-04-28 MED ORDER — METOPROLOL TARTRATE 25 MG PO TABS: 25.0000 mg | ORAL_TABLET | Freq: Two times a day (BID) | ORAL | Status: DC

## 2017-04-28 NOTE — Progress Notes (Addendum)
[facility] Pulmonary Diseases & Critical Care Medicine Progress Note  Patient Name: Pamela Chaney MRN: [id number] DOB: October 19, 1925    ADMISSION DATE:  05/06/2017 CONSULTATION DATE:  04/25/2017  REFERRING MD:  Dr. Regenia Skeeter  REASON FOR CONSULTATION:  Stroke   ASSESSMENT/PLAN:  ASSESSMENT (included in the Hospital Problem List)  Patient Active Problem List   Diagnosis Date Noted  . Atrial fibrillation with RVR ([place]) 04/26/2017    Priority: High  . Hypertensive crisis 04/26/2017    Priority: High  . CVA (cerebral vascular accident) ([place]) 04/25/2017    Priority: High  . Hypertensive emergency     Priority: Medium  . Palliative care encounter   . CHF (congestive heart failure) ([place]) 08/10/2016    PLAN/RECOMMENDATIONS   Permissive hypertension up to systolic pressure of 892 mmHg  Off aspirin due to hemorrhagic transformation. Continue atorvastatin. No anticoagulation for atrial fibrillation at this time.  Continue metoprolol PRN for rate control.  Continue nicardipine PRN and metoprolol PRN for BP control.  Repeat speech evaluation if her mental status improves.   NUTRITION: Jevity 1.2 @ 67   DVT PROPHYLAXIS: SCDs  GI PROPHYLAXIS: Protonix    SUBJECTIVE:   -Interim events: started TF yesterday. Talks and answers questions. Follows some commands (moves R extremities on command; will not open eyes on command, but does so spontaneously). Was too tired to participate in speech evaluation yesterday (right after physical therapy). 3% saline on hold for Na 158.Family at the bedside. Somnolent but arousable, answers good morning with "good morning". R facial droop. Dense L hemiparesis   HISTORY OF PRESENT ILLNESS This 84 y.o. Caucasian female was originally admitted on 05/05/2017 with acute R MCA stroke, suspected to be cardioembolic with a history of atrial fibrillation (she is no longer taking anticoagulation), remote colon cancer status post surgery and chemotherapy,  coronary artery disease, and diabetes mellitus. She presented to [facility] emergency department 1/8 after signaling her Life Alertand neighbors finding her with slurred speech and left facial droop. Last known well at about 1530 that afternoon. She was profoundly hypertensive for EMS with a systolic blood pressure in the 250s. She was treated as a code stroke upon arrival to the emergency department and a noncontrast CT of the head demonstrated hypodensity in a majority of the right MCA territory with some petechial bleeding. CT angiogram and perfusion showed a right M1 occlusion. She was not given tPA as she was considered to be outside the window and was a poor candidate for interventional radiology revascularization due to results of the CT perfusion study. She was admitted to the ICU.  REVIEW OF SYSTEMS: unobtainable due to poor participation in clinical interview  PAST MEDICAL/SURGICAL/SOCIAL/FAMILY HISTORY  Past Medical History:  Diagnosis Date  . Atrial fibrillation ([place])   . Cancer ([place])    colon  . Coronary artery disease   . Diabetes mellitus without complication ([place])   . DVT (deep venous thrombosis) ([place])   . High cholesterol   . Hypertension   . Peripheral neuropathy   . Pulmonary hypertension ([place])     Past Surgical History:  Procedure Laterality Date  . ABDOMINAL HYSTERECTOMY    . EXPLORATION POST OPERATIVE OPEN HEART      Social History   Tobacco Use  . Smoking status: Never Smoker  . Smokeless tobacco: Never Used  Substance Use Topics  . Alcohol use: No    Family History  Problem Relation Age of Onset  . Pancreatic cancer Mother   . Heart  attack Father   . Colon cancer Sister     Prior to Admission medications   Medication Sig Start Date End Date Taking? Authorizing Provider  acetaminophen (TYLENOL) 500 MG tablet Take 1 tablet by mouth every 6 (six) hours as needed for mild pain or fever.    Yes [provider]  aspirin EC 325 MG tablet  Take 325 tablets by mouth every 6 (six) hours as needed for mild pain.    Yes [provider]  furosemide (LASIX) 40 MG tablet Take 1 tablet (40 mg total) by mouth daily. 08/12/16  Yes Fritzi Mandes, MD  glipiZIDE (GLUCOTROL) 5 MG tablet Take 1 tablet by mouth 2 (two) times daily. 06/17/14  Yes [provider]  HUMULIN 70/30 KWIKPEN (70-30) 100 UNIT/ML PEN Inject 16-18 Units into the skin 2 (two) times daily. 18 units in the morning 16 units in the evening. 08/31/14  Yes [provider]  metoprolol tartrate (LOPRESSOR) 25 MG tablet Take 12.5 mg by mouth 2 (two) times daily.  08/31/14  Yes [provider]  PARoxetine (PAXIL) 20 MG tablet Take 20 mg by mouth daily.  08/31/14  Yes [provider]  Simethicone (GAS RELIEF PO) Take 1 tablet by mouth daily as needed (gas).  09/17/14  Yes [provider]    Allergies  Allergen Reactions  . Lisinopril Other (See Comments)    Causes weakness.  . Morphine And Related Other (See Comments)    Hallucinations    Current Facility-Administered Medications:  .   stroke: mapping our early stages of recovery book, , Does not apply, Once, Amie Portland, MD, Stopped at 04/25/17 1216 .  acetaminophen (TYLENOL) tablet 650 mg, 650 mg, Oral, Q4H PRN **OR** acetaminophen (TYLENOL) solution 650 mg, 650 mg, Per Tube, Q4H PRN, 650 mg at 04/28/17 0214 **OR** acetaminophen (TYLENOL) suppository 650 mg, 650 mg, Rectal, Q4H PRN, Amie Portland, MD, 650 mg at 04/27/17 1803 .  chlorhexidine (PERIDEX) 0.12 % solution 15 mL, 15 mL, Mouth Rinse, BID, Garvin Fila, MD, 15 mL at 04/27/17 2126 .  feeding supplement (JEVITY 1.2 CAL) liquid 1,000 mL, 1,000 mL, Per Tube, Continuous, Garvin Fila, MD, Last Rate: 65 mL/hr at 04/28/17 0600, 1,000 mL at 04/28/17 0600 .  feeding supplement (PRO-STAT SUGAR FREE 64) liquid 30 mL, 30 mL, Per Tube, Daily, Garvin Fila, MD, 30 mL at 04/27/17 1630 .  insulin aspart (novoLOG) injection 0-15 Units,  0-15 Units, Subcutaneous, TID WC, Amie Portland, MD, 5 Units at 04/28/17 0817 .  MEDLINE mouth rinse, 15 mL, Mouth Rinse, q12n4p, Garvin Fila, MD, 15 mL at 04/27/17 1630 .  metoprolol tartrate (LOPRESSOR) tablet 12.5 mg, 12.5 mg, Oral, BID, Rosalin Hawking, MD, 12.5 mg at 04/27/17 2300 .  nicardipine (CARDENE) 20mg  in 0.86% saline 24ml IV infusion (0.1 mg/ml), 3-15 mg/hr, Intravenous, Continuous, Garvin Fila, MD, Stopped at 04/27/17 1030 .  PARoxetine (PAXIL) tablet 20 mg, 20 mg, Oral, Daily, Rosalin Hawking, MD .  senna-docusate (Senokot-S) tablet 1 tablet, 1 tablet, Oral, QHS PRN, Amie Portland, MD .  sodium chloride (hypertonic) 3 % solution, , Intravenous, Continuous, Amie Portland, MD, Stopped at 04/27/17 1030   OBJECTIVE:   VITAL SIGNS: BP (!) 164/76 (BP Location: Left Arm)   Pulse 98   Temp 98.8 F (37.1 C) (Oral)   Resp (!) 27   Ht 5\' 8"  (1.727 m)   Wt 99.3 kg (219 lb)   SpO2 96%   BMI 33.30 kg/m  Vitals:  04/28/17 0500 04/28/17 0600 04/28/17 0700 04/28/17 0800  BP: (!) 144/81 (!) 162/83 (!) 159/88 (!) 164/76  Pulse: 97 (!) 108 93 98  Resp: (!) 29 (!) 30 (!) 27 (!) 27  Temp:    98.8 F (37.1 C)  TempSrc:    Oral  SpO2: 95% 96% 97% 96%  Weight:      Height:        HEMODYNAMICS:    VENTILATOR SETTINGS:    INTAKE / OUTPUT: I/O last 3 completed shifts: In: 3650.8 [I.V.:1737.5; Other:1000; NG/GT:913.3] Out: 2175 [Urine:2175]  PHYSICAL EXAMINATION: General: Obese elderly female in no acute distress Neuro: Alert, oriented,R gaze preference. R-sided facial droop, dense L hemiparesis. L [place]. HEENT: Normocephalic, atraumatic, no JVD, PERRL Cardiovascular: Irregularly irregular, rate controlled, no murmurs rubs gallops Lungs: Clear anteriorly but bibasilar crackles. Abdomen: Soft, nontender, nondistended Musculoskeletal: No acute deformity Skin: Grossly intact   LABS:  BMET Recent Labs  Lab 05/05/2017 2217 04/17/2017 2227 04/25/17 0142 04/26/17 0249   04/27/17 1426 04/27/17 2009 04/28/17 0417  NA 137 140  --  142   < > 156* 156* 158*  K 4.6 4.6  --  4.4  --   --   --  3.9  CL 104 105  --  107  --   --   --  125*  CO2 22  --   --  26  --   --   --  22  BUN 29* 38*  --  18  --   --   --  24*  CREATININE 1.15* 1.00 1.14* 1.13*  --   --   --  1.11*  GLUCOSE 177* 177*  --  177*  --   --   --  274*   < > = values in this interval not displayed.    Electrolytes Recent Labs  Lab 05/01/2017 2217 04/26/17 0249 04/28/17 0417  CALCIUM 8.6* 9.1 8.7*  MG  --   --  2.3  PHOS  --   --  3.0    CBC Recent Labs  Lab 04/25/17 0142 04/26/17 0249 04/28/17 0418  WBC 7.6 8.3 7.6  HGB 12.8 12.8 12.0  HCT 42.1 42.6 40.3  PLT 177 185 153    Coag's Recent Labs  Lab 05/16/2017 2217  APTT 26  INR 1.07    Sepsis Markers No results for input(s): LATICACIDVEN, PROCALCITON, O2SATVEN in the last 168 hours.  ABG Recent Labs  Lab 04/25/17 0029  PHART 7.399  PCO2ART 45.6  PO2ART 84.0    Liver Enzymes Recent Labs  Lab 04/21/2017 2217 04/28/17 0417  AST 30  --   ALT 15  --   ALKPHOS 88  --   BILITOT 1.1  --   ALBUMIN 3.6 3.2*    Cardiac Enzymes No results for input(s): TROPONINI, PROBNP in the last 168 hours.  Glucose Recent Labs  Lab 04/26/17 2120 04/27/17 0831 04/27/17 1137 04/27/17 1552 04/27/17 1936 04/28/17 0723  GLUCAP 141* 160* 159* 152* 190* 246*    Imaging Dg Chest Port 1 View  Result Date: 04/27/2017 CLINICAL DATA:  CVA. EXAM: PORTABLE CHEST 1 VIEW COMPARISON:  April 26, 2017 FINDINGS: Stable cardiomegaly. The hila and mediastinum are stable. Increasing opacities in the lung bases. Possible small effusion on the left. No other acute abnormalities. IMPRESSION: Increasing opacities in the lung bases. Recommend follow-up to resolution. Electronically Signed   By: Dorise Bullion III M.D   On: 04/27/2017 15:44    CULTURES: Results for orders  placed or performed during the hospital encounter of 05/07/2017  MRSA  PCR Screening     Status: None   Collection Time: 04/25/17  3:15 PM  Result Value Ref Range Status   MRSA by PCR NEGATIVE NEGATIVE Final    Comment:        The GeneXpert MRSA Assay (FDA approved for NASAL specimens only), is one component of a comprehensive MRSA colonization surveillance program. It is not intended to diagnose MRSA infection nor to guide or monitor treatment for MRSA infections.     FAMILY  - Updates: family updated   Renee Pain, MD Board Certified by the ABIM, [place] Pager: [phone]  04/28/2017, 8:40 AM

## 2017-04-28 NOTE — Progress Notes (Addendum)
NEUROHOSPITALISTS STROKE TEAM - DAILY PROGRESS NOTE   SUBJECTIVE (INTERVAL HISTORY) Son and daughter in law are at the bedside. Patient is found laying in bed, lethargic, eyes closed, not open on voice or pain, but following simple commands, paucity of speech but dysarthria. Off cardene and 3% saline, Na 155, still on TF. HR much improved, at 90s, will increase metoprolol dose. Will repeat CT head.     OBJECTIVE Lab Results: CBC:  Recent Labs  Lab 04/25/17 0142 04/26/17 0249 04/28/17 0418  WBC 7.6 8.3 7.6  HGB 12.8 12.8 12.0  HCT 42.1 42.6 40.3  MCV 93.8 95.9 98.8  PLT 177 185 153   BMP: Recent Labs  Lab 04/19/2017 2217 05/02/2017 2227 04/25/17 0142 04/26/17 0249  04/27/17 0221 04/27/17 0740 04/27/17 1426 04/27/17 2009 04/28/17 0417  NA 137 140  --  142   < > 152* 154* 156* 156* 158*  K 4.6 4.6  --  4.4  --   --   --   --   --  3.9  CL 104 105  --  107  --   --   --   --   --  125*  CO2 22  --   --  26  --   --   --   --   --  22  GLUCOSE 177* 177*  --  177*  --   --   --   --   --  274*  BUN 29* 38*  --  18  --   --   --   --   --  24*  CREATININE 1.15* 1.00 1.14* 1.13*  --   --   --   --   --  1.11*  CALCIUM 8.6*  --   --  9.1  --   --   --   --   --  8.7*  MG  --   --   --   --   --   --   --   --   --  2.3  PHOS  --   --   --   --   --   --   --   --   --  3.0   < > = values in this interval not displayed.   PHYSICAL EXAM Temp:  [97.6 F (36.4 C)-100.5 F (38.1 C)] 97.6 F (36.4 C) (01/12 0437) Pulse Rate:  [30-160] 93 (01/12 0700) Resp:  [15-42] 27 (01/12 0700) BP: (116-169)/(62-104) 159/88 (01/12 0700) SpO2:  [93 %-99 %] 97 % (01/12 0700) Weight:  [219 lb (99.3 kg)] 219 lb (99.3 kg) (01/12 0437) General - Well nourished, well developed, in no apparent distress HEENT-  Normocephalic, Normal external eye/conjunctiva.  Normal external ears. Normal external nose, mucus membranes and septum.   Cardiovascular  - irregularly irregular heart rate and rhythm.  Respiratory - Lungs clear bilaterally. No wheezing. Abdomen - soft and non-tender, BS normal Extremities- no edema or cyanosis NEURO:  Mental Status: drowsy, lethargic, able to tell me her name and age, but severe dysarthric, not able to answer other questions but following simple commands. Not able to name or repeat Cranial Nerves:  PERRL. EOM restricted with rightward gaze preference and inability to cross the midline to the left,, visual fields exam shows left homonymous hemianopsia, mild left lower facial weakness. Left dense hemiplegia 0/5 left upper, 2-/5 left lower on pain stimulation, 4/5 right upper, 3+/5 right lower.  Normal tone normal range of motion in the right side, flaccid left upper and lower extremity. Sensation, coordination and gait not tested.  IMAGING: I have personally reviewed the radiological images below and agree with the radiology interpretations. Ct Angio Head and Neck W Or Wo Contrast and Perfusion Result Date: 05/10/2017  IMPRESSION: 1. Occlusion of the right middle cerebral artery at the M1/M2 junction with complete loss of opacification of the right M2 superior division. 2. Right middle cerebral artery distribution infarction with ischemic penumbra volume measuring 51 mL, primarily concentrated in the posterior zone of the right MCA distribution. 3. Redemonstration of petechial hemorrhage in the distribution of the infarct, unchanged. Small focus of possible subarachnoid blood in the anterior interhemispheric fissure is also unchanged. 4. No hemodynamically significant stenosis of the carotid or vertebral arteries. 5.  Aortic Atherosclerosis (ICD10-I70.0). Critical Value/emergent results were called by telephone at the time of interpretation on 05/17/2017 at 11:53 pm to Dr. Amie [place] , who verbally acknowledged these results. Electronically Signed   By: Ulyses Jarred M.D.   On: 04/28/2017 23:54   Mr Brain Wo  Contrast Result Date: 04/25/2017 IMPRESSION: Large territory acute infarct right MCA territory with petechial hemorrhage. Local mass-effect and mild midline shift to the left. Electronically Signed   By: Franchot Gallo M.D.   On: 04/25/2017 08:27   Dg Chest Portable 1 View Result Date: 04/25/2017 IMPRESSION: Mild pulmonary edema with small left pleural effusion. Unchanged cardiomegaly. Electronically Signed   By: Ulyses Jarred M.D.   On: 04/25/2017 01:03   Ct Head Code Stroke Wo Contrast Result Date: 05/05/2017 IMPRESSION: 1. Posterior right MCA territory acute infarction with areas of petechial hemorrhage measuring up to 6.5 mm. No associated midline shift. 2. ASPECTS is 3. 3. Slight increase in size of partially calcified right frontal convexity meningioma without edema or other focal abnormality of the underlying brain. Critical Value/emergent results were called by telephone at the time of interpretation on 05/16/2017 at 10:57 pm to Dr. Rory Percy , who verbally acknowledged these results. Electronically Signed   By: Ulyses Jarred M.D.   On: 04/21/2017 23:01   Echocardiogram:                                              Left ventricle: The cavity size was normal. Wall thickness was   increased increased in a pattern of mild to moderate LVH.   Systolic function was normal. The estimated ejection fraction was   in the range of 60% to 65%  CT Head    04/26/2016                                            1. Hemorrhagic transformation of right middle cerebral artery territory infarction, with new intraparenchymal hematoma measuring 9 mL. 2. Marked worsening of mass effect with 8 mm of leftward midline shift and effacement of the right lateral ventricle.  Ct Head Wo Contrast 04/28/2017 IMPRESSION: 1. Right MCA distribution infarction, stable  in distribution, increased in edema. 2. Progression of hemorrhagic conversion with largest hematoma in right superior basal ganglia/frontal lobe measuring 52 cc,  previously 9 cc. 3. Progression of mass effect with 10 mm right-to-left midline shift, previously 8, right uncal herniation, early left ventricular entrapment with mild enlargement, partial effacement of basilar cisterns.        IMPRESSION: Ms. SHELETHA BOW is a 84 y.o. female with PMH of  A. fib not on anticoagulant, diabetes, hypertension, hyperlipidemia, congestive heart failure, presenting as acute code stroke with left facial droop, slurred speech and left-sided arm and leg flaccid weakness. Most likely a cardio embolic stroke in the setting of atrial fibrillation not on anticoagulation. Initially thought to be close to the 4-1/2-hour window since last seen normal but more history obtained from the family put her outside the window for the last known normal at 2 PM. Because of the poor aspects and a petechial bleed noticed on the noncontrast CT, not a candidate for acute intervention. Discussed findings in detail with the family, who understand the current clinical condition she is in and the rationale of therapy going forward. She also presented severely hypertensive, in A. fib with RVR, responded appropriately to metoprolol and is requiring O2 supplementation for maintaining sats. MRI reveals:  Large territory acute infarct right MCA territory with petechial hemorrhage and hemorrhagic transformation  Suspected Etiology: Likely cardio embolic from AFIB. Resultant Symptoms:  left facial droop, slurred speech and left-sided arm and leg flaccid weakness. Stroke Risk Factors: atrial fibrillation, hyperlipidemia and hypertension Other Stroke Risk Factors: Advanced age, Obesity, Body mass index is 33.3 kg/m. , CHF      04/28/2017 ASSESSMENT:   Neuro exam - no worsening, unchanged with Left sided deficits and dysarthria. CT head 04/25/16 p.m. shows hemorrhagic transformation with local mass-effect and 7 mm midline shift to the left. . Patient started on hypertonic saline. PLAN  04/28/2017: HOLD  ASA DUE to hemorrhagic transformation  Frequent neuro checks Telemetry monitoring PT/OT/SLP Consult PM & Rehab Consult Case Management /MSW Ongoing aggressive stroke risk factor management Patient will be counseled to be compliant with her antithrombotic medications Patient will be counseled on Lifestyle modifications including, Diet, Exercise, and Stress Follow up with [facility] in 6 weeks  DYSPHAGIA: NPO until passes SLP swallow evaluation Aspiration Precautions in progress  CEREBRAL EDEMA:  On hypertonic saline. Serum sodium 154 today. MEDICAL ISSUES: Appreciate PCCM consult for assistance with medical management while in the ICU  AFIB with RVR EKG shows A. Fib Continue Metoprolol for Rate control as needed Elevated Troponin's and BNP Cardiology consultation in AM Continue to HOLD [facility] - Will need long-term anticoagulation -decision on timing of starting to correlation based on clinical course and stroke size on MRI.   RESP Watch respiratory status closely for decompensation. Patient is requiring O2 supplementation for maintaining sats.  CKD Gentle IV hydration Repeat Labs in AM  Congestive heart failure Mild Pulmonary edema on admission CXR TTE pending.  May consider cards consult after test results.  HYPERTENSION: Stable Permissive hypertension - SBP greater than 180 for 24-48 hours post stroke and then gradually  Will start patient on Nicardipine drip, Labetolol PRN Long term BP goal normotensive. May slowly restart home B/P medications after 48 hours Home Meds: Metoprolol  HYPERLIPIDEMIA:    Component Value Date/Time   CHOL 227 (H) 04/25/2017 0353   TRIG 84 04/25/2017 0353   HDL 38 (L) 04/25/2017 0353   CHOLHDL 6.0 04/25/2017 0353   VLDL  17 04/25/2017 0353   LDLCALC 172 (H) 04/25/2017 0353  Home Meds:  NONE LDL  goal < 70 Will start on Lipitor to 80 mg daily, once medically stable Will continue statin at discharge  DIABETES: Lab  Results  Component Value Date   HGBA1C 6.6 (H) 04/25/2017  HgbA1c goal < 7.0 Currently on: Novolog Continue CBG monitoring and SSI to maintain glucose 140-180 mg/dl DM education   OBESITY Obesity, Body mass index is 33.3 kg/m. Greater than/equal to 30  Other Active Problems: Principal Problem:   CVA (cerebral vascular accident) [facility]) Active Problems:   Atrial fibrillation with RVR ([place])   Hypertensive crisis   Hypertensive emergency   Palliative care encounter    Hospital day # 3 VTE prophylaxis: SCD's  Diet : Diet NPO time specified   FAMILY UPDATES: family at bedside  TEAM UPDATES: Rosalin Hawking, MD STATUS:  She is a DNR at this time but not a DNI.   Prior Home Stroke Medications:  None - per notes she stopped taking AC without medical advice  Discharge Stroke Meds:  Please discharge patient on TBD   Disposition: 06-Home-Health Care Svc Therapy Recs:               PENDING Home Equipment:         PENDING Follow Up:  Follow-up Information    Garvin Fila, MD. Schedule an appointment as soon as possible for a visit in 6 week(s).   Specialties:  Neurology, Radiology Contact information: [address] [place] [address] [phone]          Idelle Crouch, MD -PCP Follow up in 1-2 weeks      ATTENDING NOTE:  I have personally examined this patient, reviewed notes, independently viewed imaging studies, participated in medical decision making and plan of care.ROS completed by me personally and pertinent positives fully documented  I have made any additions or clarifications directly to the above note.      84 year old female with history of A. fib not on AC, DM, HTN, HLD, CHF admitted for left facial droop, left-sided weakness, right gaze and left neglect.  CT showed right MCA large infarct with small patchy bleeding, old right MCA infarct.  CT head and neck showed right M1 cutoff and right frontal meningioma.  Patient not candidate for TPA or  thrombectomy.  MRI showed right MCA large infarct with again small patchy hemorrhagic transformation.  EF 60-65%, A1c 6.6, LDL 172.  Heart rate was high in A. fib, started metoprolol and heart rate better controlled.  On 04/26/17, repeat CT head showed right large MCA infarct with developing hematoma with midline shift.  BP was further controlled by Cardene, 3% saline sodium at goal.  Pelvic care involved and discussed with family, and the request continue current care.  Patient mental status seems getting worse for the last 2 days, more less responsive, less talking, however still following commands.  Repeat CT head the day showed further enlargement of right MCA hematoma with underlying large right MCA stroke and progressive midline shift with uncal herniation.  Discussed with family again, and they have not made any decision yet.  Rosalin Hawking, MD PhD Stroke Neurology 04/28/2017 6:17 PM   This patient is critically ill and at significant risk of neurological worsening, death and care requires constant monitoring of vital signs, hemodynamics,respiratory and cardiac monitoring, extensive review of multiple databases, frequent neurological assessment, discussion with family, other specialists and medical decision making of high complexity.  I had long discussion with family members at bedside, updated pt current condition, treatment plan and potential prognosis. They expressed understanding and appreciation.  I also discussed with Dr. Carson Myrtle at bedside. I spent 45 minutes of neurocritical care time  in the care of  this patient.    To contact Stroke Continuity provider, please refer to [email or web address] After hours, contact General Neurology

## 2017-04-28 NOTE — Progress Notes (Signed)
  Speech Language Pathology Treatment: Dysphagia  Patient Details Name: Pamela Chaney MRN: [id number] DOB: 10-27-1925 Today's Date: 04/28/2017 Time: 1275-1700 SLP Time Calculation (min) (ACUTE ONLY): 8 min  Assessment / Plan / Recommendation Clinical Impression  Patient seen for diagnostic dysphagia treatment, facilitating most alert state for readiness for pos. Patient repositioned to maximize alertness. Oral care provided with some facial grimacing but otherwise little oral and no pharyngeal response. Note thick dried secretions on tongue indicative of decreased production and oral management of secretions. Ice chips to lips elicited no oral response today. Despite max verbal and tactile cueing, patient unable to achieve alert state for additional po trials. Note that patient was able despite lethargy to follow basic 1-step commands intermittently. Not ready for pos. Will continue to f/u. Lethargy continues to be primary barrier to progression at this time. Education complete with daughter in Sports coach.   HPI HPI: 84 year old female admitted with right MCA CVA, now with hemorrhagic transformation measuring 3.4 cm x 2.6 cm in the area of the right MCA infarct, totaling about 9 cc causing about a 7.7 mm midline shift to the left as well. Effacement of the right lateral ventricle. No hydrocephalus.       SLP Plan  Continue with current plan of care       Recommendations  Diet recommendations: NPO Medication Administration: Via alternative means                Oral Care Recommendations: Oral care QID Follow up Recommendations: Skilled Nursing facility SLP Visit Diagnosis: Dysphagia, unspecified (R13.10) Plan: Continue with current plan of care                   [facility] [place], [place] [phone]    [name] 04/28/2017, 11:01 AM

## 2017-04-28 NOTE — Consult Note (Signed)
Consultation Note Date: 04/28/2017   Patient Name: Pamela Chaney  DOB: 06/28/1925  MRN: [id number]  Age / Sex: 84 y.o., female  PCP: Idelle Crouch, MD Referring Physician: Rosalin Hawking, MD  Reason for Consultation: Establishing goals of care, Hospice Evaluation and Psychosocial/spiritual support  HPI/Patient Profile: 84 y.o. female  with past medical history of a fib, colon ca, cad , CHF, HTN, DM admitted on 04/21/2017 with right MCA stroke likely secondary to a fib.BP systolic was in the 681'E upon adm. CT scan revealed right MCA stroke with hemorrhagic conversion and midline shift.  Consult for [place].   Clinical Assessment and Goals of Care:  Chart reviewed, pt seen. Met with pt's daughter, and 3 sons for [place] discussion. All agree to DNR/DNI and no permament feeding tube as per their mother's wishes  Daughter Patrick Jupiter is her healthcare proxy but family is making decisions as a whole. Her 3 sons, Mariane Baumgarten and Audry Pili were present and in agreement. Family shares that they do not see their mother as responsive today as she was yesterday  They describe their mother as a strong independent woman who raised 4 children on her own, loved her family and friends    SUMMARY OF RECOMMENDATIONS   Confirmed DNR/DNI No PEG tube Cont Cortrak for now Continue to treat the treatable for now Watchful waiting Code Status/Advance Care Planning:  DNR    Symptom Management:   Pain: Continue with Tylenol. Monitor for need for opioids  Palliative Prophylaxis:   Aspiration, Bowel Regimen, Delirium Protocol, Eye Care, Frequent Pain Assessment, Oral Care and Turn Reposition  Additional Recommendations (Limitations, Scope, Preferences):  No Chemotherapy and No Hemodialysis  Psycho-social/Spiritual:   Desire for further Chaplaincy support:no  Additional Recommendations: Grief/Bereavement Support  Prognosis:     Unable to determine  Discharge Planning: To Be Determined      Primary Diagnoses: Present on Admission: . Hypertensive crisis   I have reviewed the medical record, interviewed the patient and family, and examined the patient. The following aspects are pertinent.  Past Medical History:  Diagnosis Date  . Atrial fibrillation ([place])   . Cancer ([place])    colon  . Coronary artery disease   . Diabetes mellitus without complication ([place])   . DVT (deep venous thrombosis) ([place])   . High cholesterol   . Hypertension   . Peripheral neuropathy   . Pulmonary hypertension ([place])    Social History   Socioeconomic History  . Marital status: Widowed    Spouse name: None  . Number of children: None  . Years of education: None  . Highest education level: None  Social Needs  . Financial resource strain: None  . Food insecurity - worry: None  . Food insecurity - inability: None  . Transportation needs - medical: None  . Transportation needs - non-medical: None  Occupational History  . None  Tobacco Use  . Smoking status: Never Smoker  . Smokeless tobacco: Never Used  Substance and Sexual Activity  . Alcohol use: No  .  Drug use: No  . Sexual activity: None  Other Topics Concern  . None  Social History Narrative  . None   Family History  Problem Relation Age of Onset  . Pancreatic cancer Mother   . Heart attack Father   . Colon cancer Sister    Scheduled Meds: .  stroke: mapping our early stages of recovery book   Does not apply Once  . chlorhexidine  15 mL Mouth Rinse BID  . glycopyrrolate  0.2 mg Intravenous Q4H  . LORazepam  2 mg Intravenous Q4H  . mouth rinse  15 mL Mouth Rinse q12n4p  . pantoprazole (PROTONIX) IV  40 mg Intravenous QHS   Continuous Infusions: . morphine 6 mg/hr (04/28/17 1946)   PRN Meds:.acetaminophen **OR** acetaminophen (TYLENOL) oral liquid 160 mg/5 mL **OR** acetaminophen, glycopyrrolate, ketorolac, LORazepam, morphine Medications Prior to  Admission:  Prior to Admission medications   Medication Sig Start Date End Date Taking? Authorizing Provider  acetaminophen (TYLENOL) 500 MG tablet Take 1 tablet by mouth every 6 (six) hours as needed for mild pain or fever.    Yes [provider]  aspirin EC 325 MG tablet Take 325 tablets by mouth every 6 (six) hours as needed for mild pain.    Yes [provider]  furosemide (LASIX) 40 MG tablet Take 1 tablet (40 mg total) by mouth daily. 08/12/16  Yes Fritzi Mandes, MD  glipiZIDE (GLUCOTROL) 5 MG tablet Take 1 tablet by mouth 2 (two) times daily. 06/17/14  Yes [provider]  HUMULIN 70/30 KWIKPEN (70-30) 100 UNIT/ML PEN Inject 16-18 Units into the skin 2 (two) times daily. 18 units in the morning 16 units in the evening. 08/31/14  Yes [provider]  metoprolol tartrate (LOPRESSOR) 25 MG tablet Take 12.5 mg by mouth 2 (two) times daily.  08/31/14  Yes [provider]  PARoxetine (PAXIL) 20 MG tablet Take 20 mg by mouth daily.  08/31/14  Yes [provider]  Simethicone (GAS RELIEF PO) Take 1 tablet by mouth daily as needed (gas).  09/17/14  Yes [provider]   Allergies  Allergen Reactions  . Lisinopril Other (See Comments)    Causes weakness.  . Morphine And Related Other (See Comments)    Hallucinations   Review of Systems  Unable to perform ROS: Acuity of condition    Physical Exam  Constitutional:  Acutely ill appearing female; unresponsive  HENT:  Head: Normocephalic and atraumatic.  Cardiovascular:  Tachy afib  Pulmonary/Chest:  Increased work of breathing  Genitourinary:  Genitourinary Comments: foley  Neurological:  unresponsive  Skin: Skin is warm and dry.  Psychiatric:  Unable to test  Nursing note and vitals reviewed.   Vital Signs: BP (!) 150/71   Pulse (!) 101   Temp 99.2 F (37.3 C) (Axillary)   Resp (!) 30   Ht _0  (1.727 m)   Wt 99.3 kg (219 lb)   SpO2 93%   BMI 33.30 kg/m  Pain  Assessment: CPOT   Pain Score: Asleep   SpO2: SpO2: 93 % O2 Device:SpO2: 93 % O2 Flow Rate: .O2 Flow Rate (L/min): 4 L/min  IO: Intake/output summary:   Intake/Output Summary (Last 24 hours) at 04/28/2017 2008 Last data filed at 04/28/2017 1900 Gross per 24 hour  Intake 2020 ml  Output 1125 ml  Net 895 ml    LBM: Last BM Date: 04/28/17 Baseline Weight: Weight: 103.6 kg (228 lb 6.3 oz) Most recent weight: Weight: 99.3 kg (219 lb)  Palliative Assessment/Data:   Flowsheet Rows     Most Recent Value  Intake Tab  Referral Department  Critical care  Unit at Time of Referral  ICU  Palliative Care Primary Diagnosis  Neurology  Date Notified  04/25/17  Palliative Care Type  New Palliative care  Date of Admission  05/15/2017  Date first seen by Palliative Care  04/28/17  # of days Palliative referral response time  3 Day(s)  # of days IP prior to Palliative referral  1  Clinical Assessment  Palliative Performance Scale Score  20%  Pain Max last 24 hours  Not able to report  Pain Min Last 24 hours  Not able to report  Dyspnea Max Last 24 Hours  Not able to report  Dyspnea Min Last 24 hours  Not able to report  Nausea Max Last 24 Hours  Not able to report  Anxiety Max Last 24 Hours  Not able to report  Anxiety Min Last 24 Hours  Not able to report  Other Max Last 24 Hours  Not able to report  Psychosocial & Spiritual Assessment  Palliative Care Outcomes  Patient/Family meeting held?  Yes  Who was at the meeting?  3 sons, 1 dtr  Palliative Care Outcomes  Improved pain interventions, Improved non-pain symptom therapy, Clarified goals of care, Counseled regarding hospice, Provided end of life care assistance, Provided psychosocial or spiritual support, Changed to focus on comfort  Patient/Family wishes: Interventions discontinued/not started   NIPPV, Hemodialysis, PEG, Tube feedings/TPN, Vasopressors, BiPAP, Mechanical Ventilation, Transfusion, Antibiotics, Trach  Palliative Care  follow-up planned  Yes, Facility      Time In: 1300 Time Out: 1430 Time Total: 90 min Greater than 50%  of this time was spent counseling and coordinating care related to the above assessment and plan.  1830: Addendum: Called to see pt by bedside RN. Pt's repeat CT shows worsening bleed and midline shift. Nystagmus noted as well as change in respiratory status. Family aware that this is a terminal event and is opting for comfort care as per their mother's wishes. Start MS04 gtt 1-10/hr and bolus 2-4 q20 min PRN Start sch Robinul as well as PRN Start scheduled ativan 53m q4 ATC and 258mq2 PRN Toradol for fever PRN DC TF and Cortrak  Prognosis of hours to days given to dtr GaMadaline Savage GaMadaline Savages tearful but understands and accepting that her mother is nearing EOL. They are very tearful about losing their mother but want the focus to be on comfort and dignity  Time 40 additional minutes Signed by: SaDory HornNP   Please contact Palliative Medicine Team phone at [phone] questions and concerns.  For individual provider: See AmShea Evans

## 2017-04-28 NOTE — Progress Notes (Signed)
[facility] contacted,  referral    # [id number]

## 2017-04-28 NOTE — Progress Notes (Signed)
Pt transported to CT with SWOT RN, Rip [place], and transporter. Cleviprex gtt started and patient's BP is within parameters.

## 2017-04-29 DIAGNOSIS — Z515 Encounter for palliative care: Secondary | ICD-10-CM

## 2017-05-18 NOTE — Progress Notes (Signed)
NEUROHOSPITALISTS STROKE TEAM - DAILY PROGRESS NOTE   SUBJECTIVE (INTERVAL HISTORY) Son and daughter in law are at the bedside. Pt has been transitioned to comfort care only. On morphine drip now. Has agonal breathing but not in distress. Pt family has no questions.     OBJECTIVE Lab Results: CBC:  Recent Labs  Lab 04/25/17 0142 04/26/17 0249 04/28/17 0418  WBC 7.6 8.3 7.6  HGB 12.8 12.8 12.0  HCT 42.1 42.6 40.3  MCV 93.8 95.9 98.8  PLT 177 185 153   BMP: Recent Labs  Lab 04/25/2017 2217 04/30/2017 2227 04/25/17 0142 04/26/17 0249  04/27/17 1426 04/27/17 2009 04/28/17 0417 04/28/17 0724 04/28/17 1408  NA 137 140  --  142   < > 156* 156* 158* 155* 156*  K 4.6 4.6  --  4.4  --   --   --  3.9  --   --   CL 104 105  --  107  --   --   --  125*  --   --   CO2 22  --   --  26  --   --   --  22  --   --   GLUCOSE 177* 177*  --  177*  --   --   --  274*  --   --   BUN 29* 38*  --  18  --   --   --  24*  --   --   CREATININE 1.15* 1.00 1.14* 1.13*  --   --   --  1.11*  --   --   CALCIUM 8.6*  --   --  9.1  --   --   --  8.7*  --   --   MG  --   --   --   --   --   --   --  2.3  --   --   PHOS  --   --   --   --   --   --   --  3.0  --   --    < > = values in this interval not displayed.   PHYSICAL EXAM Temp:  [99.2 F (37.3 C)] 99.2 F (37.3 C) (01/12 1600) Pulse Rate:  [95-138] 101 (01/12 1915) Resp:  [30-35] 30 (01/12 1915) BP: (135-160)/(70-90) 150/71 (01/12 1915) SpO2:  [93 %-96 %] 93 % (01/12 1915)  Limited exam due to comfort care. Pt agonal breathing at this time, not in distress. Pt not moving extremities. Sensation, coordination and gait not tested.   IMAGING: I have personally reviewed the radiological images below and agree with the radiology interpretations. Ct Angio Head and Neck W Or Wo Contrast and Perfusion Result Date: 04/23/2017  IMPRESSION: 1. Occlusion of the right middle cerebral artery at the  M1/M2 junction with complete loss of opacification of the right M2 superior division. 2. Right middle cerebral artery distribution infarction with ischemic penumbra volume measuring 51 mL, primarily concentrated in the posterior zone of the right MCA distribution. 3. Redemonstration of petechial hemorrhage in the distribution of the infarct, unchanged. Small focus of possible subarachnoid blood in the anterior interhemispheric fissure  is also unchanged. 4. No hemodynamically significant stenosis of the carotid or vertebral arteries. 5.  Aortic Atherosclerosis (ICD10-I70.0). Critical Value/emergent results were called by telephone at the time of interpretation on 05/14/2017 at 11:53 pm to Dr. Amie Portland , who verbally acknowledged these results. Electronically Signed   By: Ulyses Jarred M.D.   On: 05/01/2017 23:54   Mr Brain Wo Contrast Result Date: 04/25/2017 IMPRESSION: Large territory acute infarct right MCA territory with petechial hemorrhage. Local mass-effect and mild midline shift to the left. Electronically Signed   By: Franchot Gallo M.D.   On: 04/25/2017 08:27   Dg Chest Portable 1 View Result Date: 04/25/2017 IMPRESSION: Mild pulmonary edema with small left pleural effusion. Unchanged cardiomegaly. Electronically Signed   By: Ulyses Jarred M.D.   On: 04/25/2017 01:03   Ct Head Code Stroke Wo Contrast Result Date: 05/07/2017 IMPRESSION: 1. Posterior right MCA territory acute infarction with areas of petechial hemorrhage measuring up to 6.5 mm. No associated midline shift. 2. ASPECTS is 3. 3. Slight increase in size of partially calcified right frontal convexity meningioma without edema or other focal abnormality of the underlying brain. Critical Value/emergent results were called by telephone at the time of interpretation on 04/17/2017 at 10:57 pm to Dr. Rory Percy , who verbally acknowledged these results. Electronically Signed   By: Ulyses Jarred M.D.   On: 04/27/2017 23:01   Echocardiogram:                                               Left ventricle: The cavity size was normal. Wall thickness was   increased increased in a pattern of mild to moderate LVH.   Systolic function was normal. The estimated ejection fraction was   in the range of 60% to 65%  CT Head    04/26/2016                                            1. Hemorrhagic transformation of right middle cerebral artery territory infarction, with new intraparenchymal hematoma measuring 9 mL. 2. Marked worsening of mass effect with 8 mm of leftward midline shift and effacement of the right lateral ventricle.  Ct Head Wo Contrast 04/28/2017 IMPRESSION: 1. Right MCA distribution infarction, stable in distribution, increased in edema. 2. Progression of hemorrhagic conversion with largest hematoma in right superior basal ganglia/frontal lobe measuring 52 cc, previously 9 cc. 3. Progression of mass effect with 10 mm right-to-left midline shift, previously 8, right uncal herniation, early left ventricular entrapment with mild enlargement, partial effacement of basilar cisterns.        IMPRESSION:  84 year old female with history of A. fib not on AC, DM, HTN, HLD, CHF admitted for left facial droop, left-sided weakness, right gaze and left neglect.  CT showed right MCA large infarct with small patchy bleeding, old right MCA infarct.  CT head and neck showed right M1 cutoff and right frontal meningioma.  Patient not candidate for TPA or thrombectomy.  MRI showed right MCA large infarct with again small patchy hemorrhagic transformation.  EF 60-65%, A1c 6.6, LDL 172.  Heart rate was high in A. fib, started metoprolol and heart rate better controlled.  On 04/26/17, repeat CT head showed right large MCA infarct with developing  hematoma with midline shift.  BP was further controlled by Cardene, 3% saline sodium at goal.  Pelvic care involved and discussed with family, and the request continue current care.  Patient mental status seems getting worse for  the last 2 days, more less responsive, less talking, however still following commands.  Repeat CT head the day showed further enlargement of right MCA hematoma with underlying large right MCA stroke and progressive midline shift with uncal herniation.  Discussed with family again and had palliative care consult, family eventually requested comfort care measures. Currently on morphine drip.   Pamela Hawking, MD PhD Stroke Neurology 2017/04/30 3:29 PM     To contact Stroke Continuity provider, please refer to [email or web address] After hours, contact General Neurology

## 2017-05-18 NOTE — Progress Notes (Signed)
[facility] Pulmonary Diseases & Critical Care Medicine Progress Note  Patient Name: Pamela Chaney MRN: [id number] DOB: [birth year]    ADMISSION DATE:  05/07/2017 CONSULTATION DATE:  04/25/2017  REFERRING MD:  Dr. Regenia Skeeter  REASON FOR CONSULTATION:  Stroke   ASSESSMENT/PLAN:  ASSESSMENT (included in the Hospital Problem List)  Patient Active Problem List   Diagnosis Date Noted  . Atrial fibrillation with RVR ([place]) 04/26/2017    Priority: High  . Hypertensive crisis 04/26/2017    Priority: High  . Acute ischemic right MCA stroke ([place]) 04/25/2017    Priority: High  . Hypertensive emergency     Priority: Medium  . Anterior cerebral circulation hemorrhagic infarction   . Cerebral edema (HCC)   . Palliative care by specialist   . Palliative care encounter   . CHF (congestive heart failure) ([place]) 08/10/2016    PLAN/RECOMMENDATIONS   Spoke with family members. They have decided to transition to comfort care measures only.  I will defer management decisions to the Palliative Care team.  CODE STATUS: DNR/COMFORT MEASURES ONLY    SUBJECTIVE:   -Interim events: family decided to transition the patient to DNR and comfort care only last night. She has been progressively more somnolent with abnormal patterns of respiration. Repeat head CT yesterday demonstrated stable hematoma but with increased edema. Family at the bedside. R facial droop. Dense L hemiparesis   HISTORY OF PRESENT ILLNESS This 84 y.o. Caucasian female was originally admitted on 05/01/2017 with acute R MCA stroke, suspected to be cardioembolic with a history of atrial fibrillation (she is no longer taking anticoagulation), remote colon cancer status post surgery and chemotherapy, coronary artery disease, and diabetes mellitus. She [facility] department 1/8 after signaling her Life Alertand neighbors finding her with slurred speech and left facial droop. Last known well at about 1530 that  afternoon. She was profoundly hypertensive for EMS with a systolic blood pressure in the 250s. She was treated as a code stroke upon arrival to the emergency department and a noncontrast CT of the head demonstrated hypodensity in a majority of the right MCA territory with some petechial bleeding. CT angiogram and perfusion showed a right M1 occlusion. She was not given tPA as she was considered to be outside the window and was a poor candidate for interventional radiology revascularization due to results of the CT perfusion study. She was admitted to the ICU.  REVIEW OF SYSTEMS: unobtainable due to poor participation in clinical interview  PAST MEDICAL/SURGICAL/SOCIAL/FAMILY HISTORY  Past Medical History:  Diagnosis Date  . Atrial fibrillation ([place])   . Cancer ([place])    colon  . Coronary artery disease   . Diabetes mellitus without complication ([place])   . DVT (deep venous thrombosis) ([place])   . High cholesterol   . Hypertension   . Peripheral neuropathy   . Pulmonary hypertension ([place])     Past Surgical History:  Procedure Laterality Date  . ABDOMINAL HYSTERECTOMY    . EXPLORATION POST OPERATIVE OPEN HEART      Social History   Tobacco Use  . Smoking status: Never Smoker  . Smokeless tobacco: Never Used  Substance Use Topics  . Alcohol use: No    Family History  Problem Relation Age of Onset  . Pancreatic cancer Mother   . Heart attack Father   . Colon cancer Sister     Prior to Admission medications   Medication Sig Start Date End Date Taking? Authorizing Provider  acetaminophen (TYLENOL) 500 MG  tablet Take 1 tablet by mouth every 6 (six) hours as needed for mild pain or fever.    Yes [provider]  aspirin EC 325 MG tablet Take 325 tablets by mouth every 6 (six) hours as needed for mild pain.    Yes [provider]  furosemide (LASIX) 40 MG tablet Take 1 tablet (40 mg total) by mouth daily. 08/12/16  Yes Fritzi Mandes, MD  glipiZIDE (GLUCOTROL) 5 MG  tablet Take 1 tablet by mouth 2 (two) times daily. 06/17/14  Yes [provider]  HUMULIN 70/30 KWIKPEN (70-30) 100 UNIT/ML PEN Inject 16-18 Units into the skin 2 (two) times daily. 18 units in the morning 16 units in the evening. 08/31/14  Yes [provider]  metoprolol tartrate (LOPRESSOR) 25 MG tablet Take 12.5 mg by mouth 2 (two) times daily.  08/31/14  Yes [provider]  PARoxetine (PAXIL) 20 MG tablet Take 20 mg by mouth daily.  08/31/14  Yes [provider]  Simethicone (GAS RELIEF PO) Take 1 tablet by mouth daily as needed (gas).  09/17/14  Yes [provider]    Allergies  Allergen Reactions  . Lisinopril Other (See Comments)    Causes weakness.  . Morphine And Related Other (See Comments)    Hallucinations    Current Facility-Administered Medications:  .   stroke: mapping our early stages of recovery book, , Does not apply, Once, Amie Portland, MD, Stopped at 04/25/17 1216 .  acetaminophen (TYLENOL) tablet 650 mg, 650 mg, Oral, Q4H PRN **OR** acetaminophen (TYLENOL) solution 650 mg, 650 mg, Per Tube, Q4H PRN, 650 mg at 04/28/17 0214 **OR** acetaminophen (TYLENOL) suppository 650 mg, 650 mg, Rectal, Q4H PRN, Amie Portland, MD, 650 mg at 04/27/17 1803 .  chlorhexidine (PERIDEX) 0.12 % solution 15 mL, 15 mL, Mouth Rinse, BID, Garvin Fila, MD, 15 mL at 04/28/17 2201 .  glycopyrrolate (ROBINUL) injection 0.2 mg, 0.2 mg, Intravenous, Q4H, Bullard, Elray Mcgregor, NP, 0.2 mg at 05/20/2017 0649 .  glycopyrrolate (ROBINUL) injection 0.2-0.4 mg, 0.2-0.4 mg, Intravenous, Q4H PRN, Bullard, Elray Mcgregor, NP .  ketorolac (TORADOL) 15 MG/ML injection 15 mg, 15 mg, Intravenous, Q6H PRN, Bullard, Elray Mcgregor, NP .  LORazepam (ATIVAN) injection 1-2 mg, 1-2 mg, Intravenous, Q2H PRN, Bullard, Elray Mcgregor, NP, 2 mg at 04/28/17 1852 .  LORazepam (ATIVAN) injection 2 mg, 2 mg, Intravenous, Q4H, Bullard, Elray Mcgregor, NP, 2 mg at May 20, 2017 0649 .  MEDLINE mouth rinse,  15 mL, Mouth Rinse, q12n4p, Garvin Fila, MD, 15 mL at 04/28/17 1700 .  morphine 250 mg in sodium chloride 0.9 % 250 mL (1 mg/mL) infusion, 1-10 mg/hr, Intravenous, Continuous, Bullard, Elray Mcgregor, NP, Last Rate: 7 mL/hr at 04/28/17 2301, 7 mg/hr at 04/28/17 2301 .  morphine bolus via infusion 2-4 mg, 2-4 mg, Intravenous, Q20 Min PRN, Bullard, Elray Mcgregor, NP, 2 mg at 04/28/17 2240 .  pantoprazole (PROTONIX) injection 40 mg, 40 mg, Intravenous, QHS, Renee Pain, MD, 40 mg at 04/28/17 2203   OBJECTIVE:   VITAL SIGNS: BP (!) 150/71 (BP Location: Left Arm)   Pulse (!) 101   Temp 99.2 F (37.3 C) (Axillary)   Resp (!) 30   Ht 5\' 8"  (1.727 m)   Wt 99.3 kg (219 lb)   SpO2 93%   BMI 33.30 kg/m  Vitals:   04/28/17 1830 04/28/17 1845 04/28/17 1900 04/28/17 1915  BP: (!) 149/72 (!) 155/70 135/73 (!) 150/71  Pulse: (!) 109 95 (!) 138 (!) 101  Resp: (!) 30 (!) 35 (!) 33 (!) 30  Temp:      TempSrc:      SpO2: 96% 96% 93% 93%  Weight:      Height:        HEMODYNAMICS:    VENTILATOR SETTINGS:    INTAKE / OUTPUT: I/O last 3 completed shifts: In: 2300 [Other:1000; NG/GT:1300] Out: 1200 [Urine:1200]  PHYSICAL EXAMINATION: General: Obese elderly female in no acute distress Neuro: Somnolent. R gaze preference. R-sided facial droop, dense L hemiparesis. L Bab[place]i. HEENT: Normocephalic, atraumatic, no JVD, PERRL Cardiovascular: Irregularly irregular, rate controlled, no murmurs rubs gallops Lungs: Clear anteriorly but bibasilar crackles. Abdomen: Soft, nontender, nondistended Musculoskeletal: No acute deformity Skin: Grossly intact   LABS:  BMET Recent Labs  Lab 04/21/2017 2217 04/19/2017 2227 04/25/17 0142 04/26/17 0249  04/28/17 0417 04/28/17 0724 04/28/17 1408  NA 137 140  --  142   < > 158* 155* 156*  K 4.6 4.6  --  4.4  --  3.9  --   --   CL 104 105  --  107  --  125*  --   --   CO2 22  --   --  26  --  22  --   --   BUN 29* 38*  --  18  --  24*  --   --    CREATININE 1.15* 1.00 1.14* 1.13*  --  1.11*  --   --   GLUCOSE 177* 177*  --  177*  --  274*  --   --    < > = values in this interval not displayed.    Electrolytes Recent Labs  Lab 05/06/2017 2217 04/26/17 0249 04/28/17 0417  CALCIUM 8.6* 9.1 8.7*  MG  --   --  2.3  PHOS  --   --  3.0    CBC Recent Labs  Lab 04/25/17 0142 04/26/17 0249 04/28/17 0418  WBC 7.6 8.3 7.6  HGB 12.8 12.8 12.0  HCT 42.1 42.6 40.3  PLT 177 185 153    Coag's Recent Labs  Lab 05/02/2017 2217  APTT 26  INR 1.07    Sepsis Markers No results for input(s): LATICACIDVEN, PROCALCITON, O2SATVEN in the last 168 hours.  ABG Recent Labs  Lab 04/25/17 0029  PHART 7.399  PCO2ART 45.6  PO2ART 84.0    Liver Enzymes Recent Labs  Lab 05/13/2017 2217 04/28/17 0417  AST 30  --   ALT 15  --   ALKPHOS 88  --   BILITOT 1.1  --   ALBUMIN 3.6 3.2*    Cardiac Enzymes No results for input(s): TROPONINI, PROBNP in the last 168 hours.  Glucose Recent Labs  Lab 04/27/17 1137 04/27/17 1552 04/27/17 1936 04/28/17 0723 04/28/17 1206 04/28/17 1549  GLUCAP 159* 152* 190* 246* 265* 171*    Imaging Ct Head Wo Contrast  Result Date: 04/28/2017 CLINICAL DATA:  84 y/o  F; hemorrhagic stroke for follow-up. EXAM: CT HEAD WITHOUT CONTRAST TECHNIQUE: Contiguous axial images were obtained from the base of the skull through the vertex without intravenous contrast. COMPARISON:  04/25/2016 MRI of the head.  04/26/2016 CT of the head. FINDINGS: Brain: Progressive hemorrhage within right MCA distribution infarct with a large focus in the right superior basal ganglia and frontal lobe measuring 6.3 x 3.7 x 4.3 cm (volume = 52 cm^3). Progressive mass effect with 10 mm right-to-left midline shift, effacement of right lateral ventricle and third ventricle, left ventricular enlargement likely representing trapped  ventricle, right uncal herniation, and partial effacement of right aspect of suprasellar and quadrigeminal  plate cisterns. Vascular: Moderate calcific atherosclerosis of carotid siphons. Skull: Normal. Negative for fracture or focal lesion. Sinuses/Orbits: No acute finding. Bilateral intra-ocular lens replacement. Other: Nasoenteric tube. IMPRESSION: 1. Right MCA distribution infarction, stable in distribution, increased in edema. 2. Progression of hemorrhagic conversion with largest hematoma in right superior basal ganglia/frontal lobe measuring 52 cc, previously 9 cc. 3. Progression of mass effect with 10 mm right-to-left midline shift, previously 8, right uncal herniation, early left ventricular entrapment with mild enlargement, partial effacement of basilar cisterns. These results will be called to the ordering clinician or representative by the Radiologist Assistant, and communication documented in the PACS or zVision Dashboard. Electronically Signed   By: Kristine Garbe M.D.   On: 04/28/2017 15:12    CULTURES: Results for orders placed or performed during the hospital encounter of 04/20/2017  MRSA PCR Screening     Status: None   Collection Time: 04/25/17  3:15 PM  Result Value Ref Range Status   MRSA by PCR NEGATIVE NEGATIVE Final    Comment:        The GeneXpert MRSA Assay (FDA approved for NASAL specimens only), is one component of a comprehensive MRSA colonization surveillance program. It is not intended to diagnose MRSA infection nor to guide or monitor treatment for MRSA infections.     FAMILY  - Updates: family updated   Renee Pain, MD Board Certified by the [employer], [place] Pager: [phone]  01-May-2017, 11:53 AM

## 2017-05-18 NOTE — Progress Notes (Signed)
Daily Progress Note   Patient Name: Pamela Chaney       Date: 05-03-2017 DOB: 04/17/1926  Age: 84 y.o. MRN#: [id number] Attending Physician: Rosalin Hawking, MD Primary Care Physician: Idelle Crouch, MD Admit Date: 04/17/2017  Reason for Consultation/Follow-up: Non pain symptom management, Pain control, Psychosocial/spiritual support and Terminal Care  Subjective: Chart reviewed, patient seen.  Daughter Madaline Savage, son Nori Riis at the bedside.  Morphine continuous infusion started 04/28/2017 as well as scheduled Ativan.  Patient's respiratory status is much easier.  Respiratory rate in the 20s.  No upper airway secretions noted.  No nonverbal signs and symptoms of pain.  Overall family is coping well.  Length of Stay: 4  Current Medications: Scheduled Meds:  .  stroke: mapping our early stages of recovery book   Does not apply Once  . chlorhexidine  15 mL Mouth Rinse BID  . glycopyrrolate  0.2 mg Intravenous Q4H  . LORazepam  2 mg Intravenous Q4H  . mouth rinse  15 mL Mouth Rinse q12n4p  . pantoprazole (PROTONIX) IV  40 mg Intravenous QHS    Continuous Infusions: . morphine 7 mg/hr (04/28/17 2301)    PRN Meds: acetaminophen **OR** acetaminophen (TYLENOL) oral liquid 160 mg/5 mL **OR** acetaminophen, glycopyrrolate, ketorolac, LORazepam, morphine  Physical Exam  Constitutional:  Acutely ill-appearing elderly female; she appears to be transitioning towards end of life.  Unresponsive  HENT:  Head: Normocephalic and atraumatic.  Cardiovascular:  A. fib with RVR  Pulmonary/Chest:  Mild increased work of breathing  Abdominal: Soft.  Genitourinary:  Genitourinary Comments: Foley  Neurological:  Unresponsive  Skin: Skin is warm and dry.  Psychiatric:  Unable to test No agitation    Nursing note and vitals reviewed.           Vital Signs: BP (!) 150/71 (BP Location: Left Arm)   Pulse (!) 101   Temp 99.2 F (37.3 C) (Axillary)   Resp (!) 30   Ht 5\' 8"  (1.727 m)   Wt 99.3 kg (219 lb)   SpO2 93%   BMI 33.30 kg/m  SpO2: SpO2: 93 % O2 Device: O2 Device: Nasal Cannula O2 Flow Rate: O2 Flow Rate (L/min): 4 L/min  Intake/output summary:   Intake/Output Summary (Last 24 hours) at 2017-05-03 1129 Last data filed at May 03, 2017 0700 Gross per 24 hour  Intake 195 ml  Output 800 ml  Net -605 ml   LBM: Last BM Date: 04/28/17 Baseline Weight: Weight: 103.6 kg (228 lb 6.3 oz) Most recent weight: Weight: 99.3 kg (219 lb)       Palliative Assessment/Data:    Flowsheet Rows     Most Recent Value  Intake Tab  Referral Department  Critical care  Unit at Time of Referral  ICU  Palliative Care Primary Diagnosis  Neurology  Date Notified  04/25/17  Palliative Care Type  New Palliative care  Date of Admission  05/10/2017  Date first seen by Palliative Care  04/28/17  # of days Palliative referral response time  3 Day(s)  # of days IP prior to Palliative referral  1  Clinical Assessment  Palliative Performance Scale Score  20%  Pain Max last 24 hours  Not able to report  Pain Min Last 24 hours  Not able to report  Dyspnea Max Last 24 Hours  Not able to report  Dyspnea Min Last 24 hours  Not able to report  Nausea Max Last 24 Hours  Not able to report  Anxiety Max Last 24 Hours  Not able to report  Anxiety Min Last 24 Hours  Not able to report  Other Max Last 24 Hours  Not able to report  Psychosocial & Spiritual Assessment  Palliative Care Outcomes  Patient/Family meeting held?  Yes  Who was at the meeting?  3 sons, 1 dtr  Palliative Care Outcomes  Improved pain interventions, Improved non-pain symptom therapy, Clarified goals of care, Counseled regarding hospice, Provided end of life care assistance, Provided psychosocial or spiritual support, Changed to focus  on comfort  Patient/Family wishes: Interventions discontinued/not started   NIPPV, Hemodialysis, PEG, Tube feedings/TPN, Vasopressors, BiPAP, Mechanical Ventilation, Transfusion, Antibiotics, Trach  Palliative Care follow-up planned  Yes, Facility      Patient Active Problem List   Diagnosis Date Noted  . Anterior cerebral circulation hemorrhagic infarction   . Cerebral edema (HCC)   . Palliative care by specialist   . Palliative care encounter   . Atrial fibrillation with RVR ([place]) 04/26/2017  . Hypertensive crisis 04/26/2017  . Hypertensive emergency   . Acute ischemic right MCA stroke ([place]) 04/25/2017  . CHF (congestive heart failure) ([place]) 08/10/2016    Palliative Care Assessment & Plan   Patient Profile: 84 y.o. female  with past medical history of a fib, colon ca, cad , CHF, HTN, DM admitted on 04/30/2017 with right MCA stroke likely secondary to a fib.BP systolic was in the 008'Q upon adm. CT scan revealed right MCA stroke with hemorrhagic conversion and midline shift.  Consult for [place].   CT scan performed on 04/28/2016 reveals worsening midline shift.  Patient now comfort care.  Unresponsive, transitioning towards end of life  Recommendations/Plan:  Dyspnea/pain: Improving.  Continue with morphine continuous infusion.  Current rate at 7 mg an hour with 2-4 mg every 20 minutes as needed for breakthrough pain or shortness of breath; titration parameters for basal rate up to 10 mg an hour.  We will continue to monitor and titrate for effect  Agitation: Improving.  Continue with Ativan 2 mg every 4 hours scheduled as well as every 2 hours for breakthrough  Secretions: Improving.  Continue with scheduled Robinul  Goals of Care and Additional Recommendations:  Limitations on Scope of Treatment: Full Comfort Care  Code Status:    Code Status Orders  (From admission, onward)  Start     Ordered   04/28/17 2000  Do not attempt resuscitation (DNR)  Continuous     Question Answer Comment  In the event of cardiac or respiratory ARREST Do not call a "code blue"   In the event of cardiac or respiratory ARREST Do not perform Intubation, CPR, defibrillation or ACLS   In the event of cardiac or respiratory ARREST Use medication by any route, position, wound care, and other measures to relive pain and suffering. May use oxygen, suction and manual treatment of airway obstruction as needed for comfort.      04/28/17 2001    Code Status History    Date Active Date Inactive Code Status Order ID Comments User Context   04/26/2017 09:28 04/28/2017 20:01 DNR [id number]  Arnell Asal, NP Inpatient   04/25/2017 01:44 04/26/2017 09:28 Partial Code [id number]  Amie Portland, MD ED   08/10/2016 17:37 08/12/2016 13:59 Full Code [id number]  Henreitta Leber, MD ED    Advance Directive Documentation     Most Recent Value  Type of Advance Directive  Healthcare Power of Attorney, Living will  Pre-existing out of facility DNR order (yellow form or pink MOST form)  No data  "MOST" Form in Place?  No data       Prognosis:   Hours - Days in the setting of right MCA stroke with hemorrhagic conversion and worsening midline shift  Discharge Planning:  Anticipated Hospital Death  Care plan was discussed with bedside RN  Thank you for allowing the Palliative Medicine Team to assist in the care of this patient.   Time In: 0830 Time Out: 0910 Total Time 40 min Prolonged Time Billed  no       Greater than 50%  of this time was spent counseling and coordinating care related to the above assessment and plan.  Dory Horn, NP  Please contact Palliative Medicine Team phone at [phone] for questions and concerns.

## 2017-05-18 NOTE — Death Summary Note (Signed)
Stroke Discharge Summary  Patient ID: Pamela Chaney   MRN: [id number]      DOB: [birth year]  Date of Admission: 04/17/2017 Date of Discharge: 2017-05-14  Attending Physician:  Rosalin Hawking, MD, Stroke MD Consultant(s):    pulmonary/intensive care  Patient's PCP:  Idelle Crouch, MD  DISCHARGE DIAGNOSIS:  Principal Problem:   Acute ischemic right MCA stroke [facility]) with hemorrhagic conversion Active Problems:   Atrial fibrillation with RVR [facility])   Hypertensive emergency   Palliative care encounter   Anterior cerebral circulation hemorrhagic infarction   Cerebral edema ([place])   Palliative care by specialist   DM   HLD   Past Medical History:  Diagnosis Date  . Atrial fibrillation ([place])   . Cancer ([place])    colon  . Coronary artery disease   . Diabetes mellitus without complication ([place])   . DVT (deep venous thrombosis) ([place])   . High cholesterol   . Hypertension   . Peripheral neuropathy   . Pulmonary hypertension ([place])    Past Surgical History:  Procedure Laterality Date  . ABDOMINAL HYSTERECTOMY    . EXPLORATION POST OPERATIVE OPEN HEART      LABORATORY STUDIES CBC    Component Value Date/Time   WBC 7.6 04/28/2017 0418   RBC 4.08 04/28/2017 0418   HGB 12.0 04/28/2017 0418   HGB 12.8 10/05/2011 0922   HCT 40.3 04/28/2017 0418   HCT 40.2 10/05/2011 0922   PLT 153 04/28/2017 0418   PLT 215 10/05/2011 0922   MCV 98.8 04/28/2017 0418   MCV 92 10/05/2011 0922   MCH 29.4 04/28/2017 0418   MCHC 29.8 (L) 04/28/2017 0418   RDW 15.4 04/28/2017 0418   RDW 14.6 (H) 10/05/2011 0922   LYMPHSABS 2.0 04/17/2017 2217   LYMPHSABS 2.0 10/05/2011 0922   MONOABS 0.4 05/07/2017 2217   MONOABS 0.4 10/05/2011 0922   EOSABS 0.0 05/14/2017 2217   EOSABS 0.1 10/05/2011 0922   BASOSABS 0.0 04/23/2017 2217   BASOSABS 0.0 10/05/2011 0922   CMP    Component Value Date/Time   NA 156 (H) 04/28/2017 1408   NA 138 10/05/2011 0922   K 3.9 04/28/2017 0417   K 4.4 10/05/2011  0922   CL 125 (H) 04/28/2017 0417   CL 104 10/05/2011 0922   CO2 22 04/28/2017 0417   CO2 24 10/05/2011 0922   GLUCOSE 274 (H) 04/28/2017 0417   GLUCOSE 239 (H) 10/05/2011 0922   BUN 24 (H) 04/28/2017 0417   BUN 16 10/05/2011 0922   CREATININE 1.11 (H) 04/28/2017 0417   CREATININE 1.09 10/05/2011 0922   CALCIUM 8.7 (L) 04/28/2017 0417   CALCIUM 9.0 10/05/2011 0922   PROT 7.1 05/07/2017 2217   PROT 7.6 10/05/2011 0922   ALBUMIN 3.2 (L) 04/28/2017 0417   ALBUMIN 3.5 10/05/2011 0922   AST 30 05/12/2017 2217   AST 26 10/05/2011 0922   ALT 15 05/11/2017 2217   ALT 30 10/05/2011 0922   ALKPHOS 88 04/25/2017 2217   ALKPHOS 113 10/05/2011 0922   BILITOT 1.1 04/22/2017 2217   BILITOT 0.3 10/05/2011 0922   GFRNONAA 43 (L) 04/28/2017 0417   GFRNONAA 47 (L) 10/05/2011 0922   GFRAA 50 (L) 04/28/2017 0417   GFRAA 54 (L) 10/05/2011 0922   COAGS Lab Results  Component Value Date   INR 1.07 04/20/2017   Lipid Panel    Component Value Date/Time   CHOL 227 (H) 04/25/2017 0353   TRIG 84 04/25/2017 0353  HDL 38 (L) 04/25/2017 0353   CHOLHDL 6.0 04/25/2017 0353   VLDL 17 04/25/2017 0353   LDLCALC 172 (H) 04/25/2017 0353   HgbA1C  Lab Results  Component Value Date   HGBA1C 6.6 (H) 04/25/2017   Urinalysis    Component Value Date/Time   COLORURINE YELLOW 04/25/2017 0510   APPEARANCEUR CLEAR 04/25/2017 0510   LABSPEC 1.043 (H) 04/25/2017 0510   PHURINE 6.0 04/25/2017 0510   GLUCOSEU NEGATIVE 04/25/2017 0510   HGBUR SMALL (A) 04/25/2017 0510   BILIRUBINUR NEGATIVE 04/25/2017 0510   KETONESUR NEGATIVE 04/25/2017 0510   PROTEINUR 30 (A) 04/25/2017 0510   NITRITE NEGATIVE 04/25/2017 0510   LEUKOCYTESUR NEGATIVE 04/25/2017 0510   Urine Drug Screen     Component Value Date/Time   LABOPIA NONE DETECTED 04/25/2017 0514   COCAINSCRNUR NONE DETECTED 04/25/2017 0514   LABBENZ NONE DETECTED 04/25/2017 0514   AMPHETMU NONE DETECTED 04/25/2017 0514   THCU NONE DETECTED 04/25/2017  0514   LABBARB NONE DETECTED 04/25/2017 0514    Alcohol Level    Component Value Date/Time   ETH <10 05/05/2017 2217     SIGNIFICANT DIAGNOSTIC STUDIES Ct Angio Head and Neck W Or Wo Contrast and Perfusion Result Date: 05/05/2017  IMPRESSION: 1. Occlusion of the right middle cerebral artery at the M1/M2 junction with complete loss of opacification of the right M2 superior division. 2. Right middle cerebral artery distribution infarction with ischemic penumbra volume measuring 51 mL, primarily concentrated in the posterior zone of the right MCA distribution. 3. Redemonstration of petechial hemorrhage in the distribution of the infarct, unchanged. Small focus of possible subarachnoid blood in the anterior interhemispheric fissure is also unchanged. 4. No hemodynamically significant stenosis of the carotid or vertebral arteries. 5.  Aortic Atherosclerosis (ICD10-I70.0). Critical Value/emergent results were called by telephone at the time of interpretation on 05/03/2017 at 11:53 pm to Dr. Amie Portland , who verbally acknowledged these results. Electronically Signed   By: Ulyses Jarred M.D.   On: 05/12/2017 23:54   Mr Brain Wo Contrast Result Date: 04/25/2017 IMPRESSION: Large territory acute infarct right MCA territory with petechial hemorrhage. Local mass-effect and mild midline shift to the left. Electronically Signed   By: Franchot Gallo M.D.   On: 04/25/2017 08:27   Dg Chest Portable 1 View Result Date: 04/25/2017 IMPRESSION: Mild pulmonary edema with small left pleural effusion. Unchanged cardiomegaly. Electronically Signed   By: Ulyses Jarred M.D.   On: 04/25/2017 01:03   Ct Head Code Stroke Wo Contrast Result Date: 04/18/2017 IMPRESSION: 1. Posterior right MCA territory acute infarction with areas of petechial hemorrhage measuring up to 6.5 mm. No associated midline shift. 2. ASPECTS is 3. 3. Slight increase in size of partially calcified right frontal convexity meningioma without edema or other  focal abnormality of the underlying brain. Critical Value/emergent results were called by telephone at the time of interpretation on 05/06/2017 at 10:57 pm to Dr. Rory Percy , who verbally acknowledged these results. Electronically Signed   By: Ulyses Jarred M.D.   On: 05/04/2017 23:01   Echocardiogram:                                               Left ventricle: The cavity size was normal. Wall thickness was increased increased in a pattern of mild to moderate LVH. Systolic function was normal. The estimated ejection fraction was  in the range of 60% to 65%  CT Head    04/26/2016                                            1. Hemorrhagic transformation of right middle cerebral artery territory infarction, with new intraparenchymal hematoma measuring 9 mL. 2. Marked worsening of mass effect with 8 mm of leftward midline shift and effacement of the right lateral ventricle.  Ct Head Wo Contrast 04/28/2017 IMPRESSION: 1. Right MCA distribution infarction, stable in distribution, increased in edema. 2. Progression of hemorrhagic conversion with largest hematoma in right superior basal ganglia/frontal lobe measuring 52 cc, previously 9 cc. 3. Progression of mass effect with 10 mm right-to-left midline shift, previously 8, right uncal herniation, early left ventricular entrapment with mild enlargement, partial effacement of basilar cisterns.     HISTORY OF PRESENT ILLNESS Tomeka Kantner Pageis a 84 y.o.femalepast medical history of A. fib not on anticoagulant, diabetes, hypertension, hyperlipidemia, congestive heart failure, who is brought in as a code stroke from home [facility]. Initial report said last known normal 6 PM when she was noted to have left-sided facial droop, slurred speech and left-sided weakness. Upon family arrival, they report that her last definitive seen normal was somewhere around 2 PM to 6 PM is when she was found to be weak on the left side. She has a history of atrial  fibrillation, not on anticoagulation as she discontinued her apixaban because of "wheezing problems with that medication". She is currently not on anticoagulation. She has had a stroke in the past based on imaging but she has never had any symptoms related to her stroke. She has a history of congestive heart failure, for which she follows up with cardiology. The [facility], systolic blood pressure in the 250s. The report complete left-sided neglect, left hemiplegia and right gaze deviation along with the facial droop. Upon initial evaluation in the emergency room, was consistent with complete right MCA syndrome. Noncontrast CT of the head showed hypodensity in majority of the right MCA territory with some petechial bleeding as well along with a poor ASPECTS score of 3. There was some delay in obtaining IV access and obtaining CTA and perfusion. CT and perfusion showed a right M1 occlusion with a mismatch of 51 cc. Case was discussed with neuro interventional specialist and based on the poor aspect score and reasonable core, decided to not be a candidate for intervention. She was past the window for IV TPA upon presentation. At baseline, walks with a cane, drives car, lives independently, performs ADLs independently.  LKW: 2 PM on 04/25/2017 tpa given?: no, outside the window Premorbid modified Rankin scale (mRS):2   HOSPITAL COURSE 84 year old female with history of A. fib not on AC, DM, HTN, HLD, CHF admitted for left facial droop, left-sided weakness, right gaze and left neglect.  CT showed right MCA large infarct with small patchy bleeding, old right MCA infarct.  CT head and neck showed right M1 cutoff and right frontal meningioma.  Patient not candidate for TPA or thrombectomy.  MRI showed right MCA large infarct with again small patchy hemorrhagic transformation.  EF 60-65%, A1c 6.6, LDL 172.  Heart rate was high in A. fib, started metoprolol and heart rate  better controlled.  On 04/26/17, repeat CT head showed right large MCA infarct with developing  hematoma with midline shift.  BP was further controlled by Cardene, 3% saline sodium at goal.  Pelvic care involved and discussed with family, and the request continue current care.  Patient mental status seems getting worse for the last 2 days, more less responsive, less talking, however still following commands.  Repeat CT head the day showed further enlargement of right MCA hematoma with underlying large right MCA stroke and progressive midline shift with uncal herniation.  Discussed with family again and had palliative care consult, family eventually requested comfort care measures. Put on morphine drip. pt passed away on 05-18-2017 1452.    DISCHARGE EXAM Pt deceased   35 minutes were spent preparing discharge.  Rosalin Hawking, MD PhD Stroke Neurology 05/18/17 4:00 PM

## 2017-05-18 NOTE — Progress Notes (Signed)
The Palliative nurse practitioner met with the family and began comfort measures for the patient on 04-28-17 at 1900. A morphine gtt was started to ease the patient's tachypnea. PRN medications were available to the nursing staff to maintain patient comfort. The patient passed at 1452 on 04/30/17. Her eldest son and two grandchildren were at the bedside. Two nurses auscultated for heart and lung sides to confirm the patient had passed. Dr. Erlinda Hong of Neurology was notified and he presented to sign the death certificate. CDS was notified; the patient is a possible candidate for tissue donation. The patient is not a medical examiner case. The post-mortem checklist is complete.

## 2017-05-18 NOTE — Progress Notes (Signed)
150 mL of IV morphine was wasted in the sink with Sheralyn Boatman, RN.

## 2017-05-18 DEATH — deceased

## 2019-02-21 IMAGING — CT CT HEAD CODE STROKE
3 series · 15 of 47 positions shown, 18 images · non-contrast
Comparison: Head CT 11/18/2014

CLINICAL DATA: Code stroke. Right-sided facial droop. Other
left-sided deficits.

EXAM:
CT HEAD WITHOUT CONTRAST
TECHNIQUE: Contiguous axial images were obtained from the base of the skull
through the vertex without intravenous contrast.

[Series 3: head 5.0 st · axial · 0.47mm/px · z∈[-128,+22]mm · 9 of 36 slices shown, 12 images]
[im 3/36  brain]
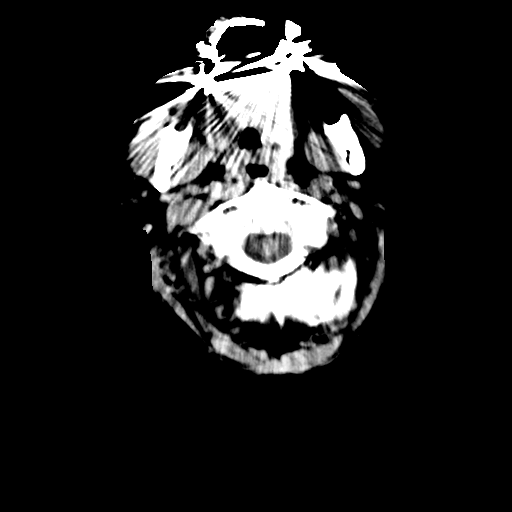
[im 3/36  bone]
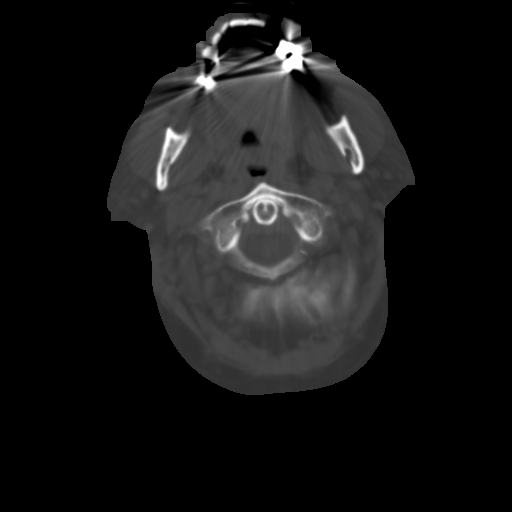
[im 7/36  brain]
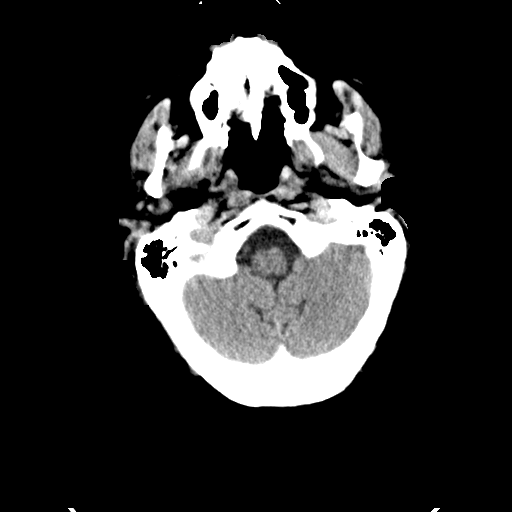
[im 10/36  brain]
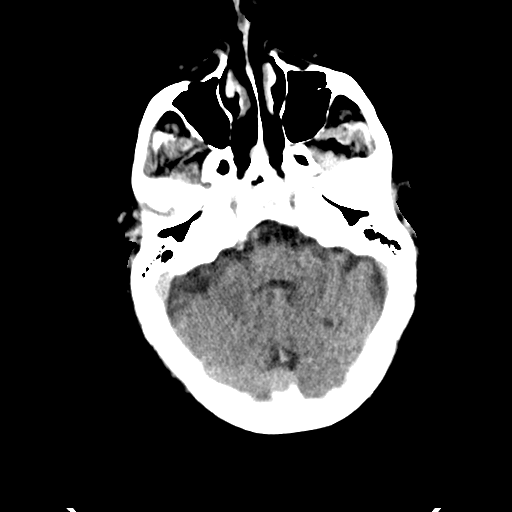
[im 14/36  brain]
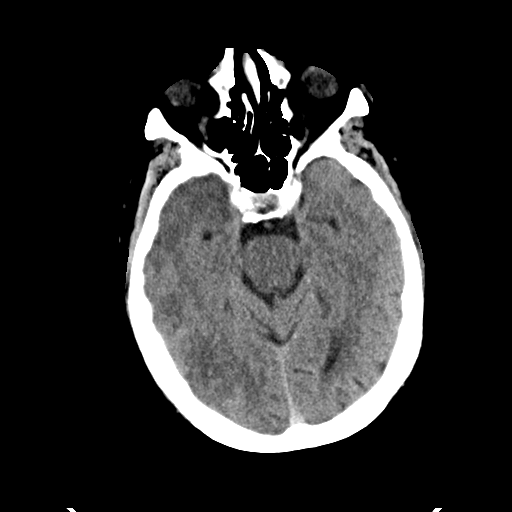
[im 19/36  brain]
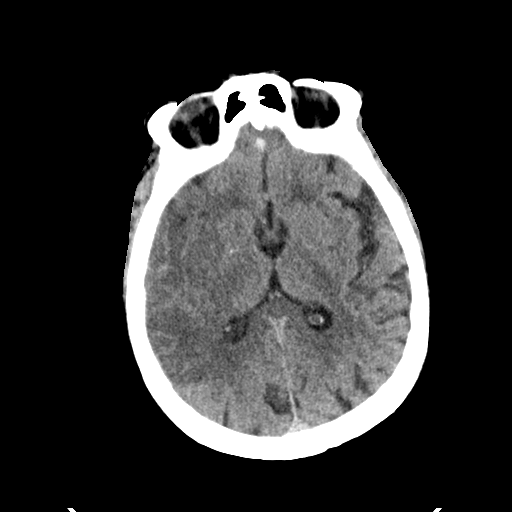
[im 19/36  bone]
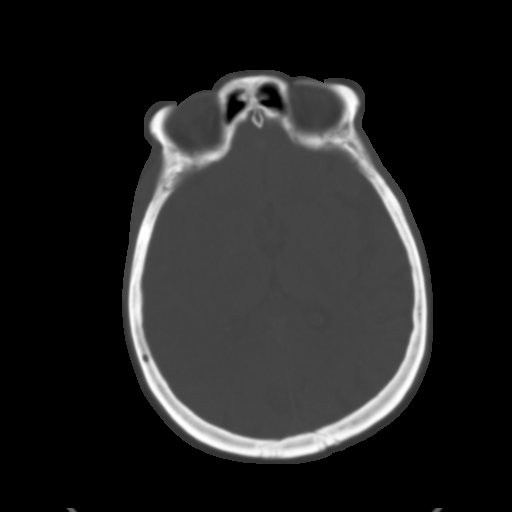
[im 22/36  brain]
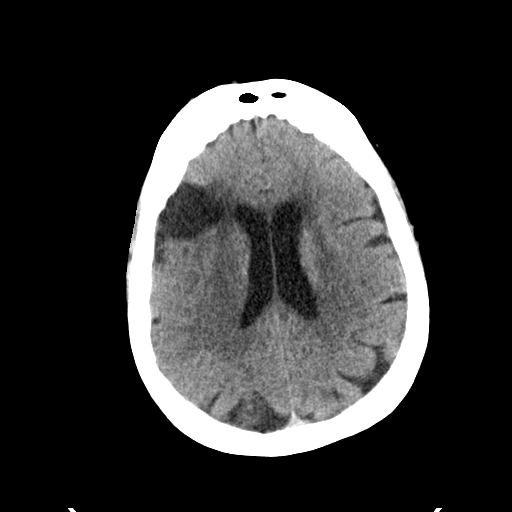
[im 26/36  brain]
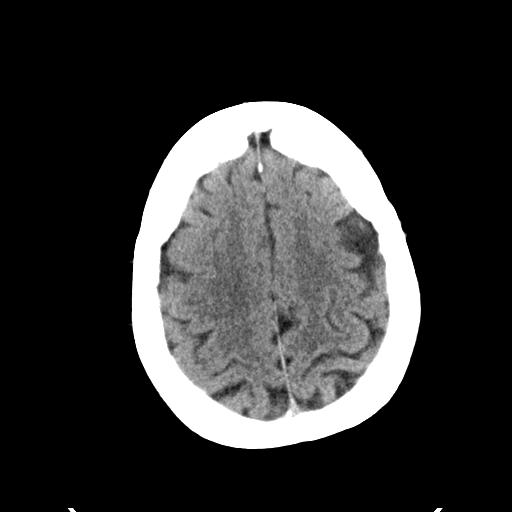
[im 29/36  brain]
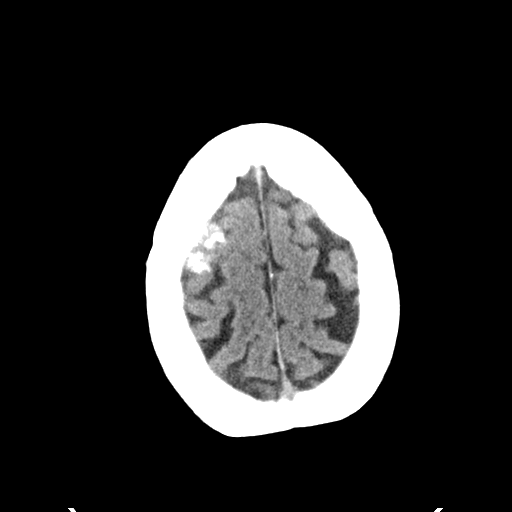
[im 33/36  brain]
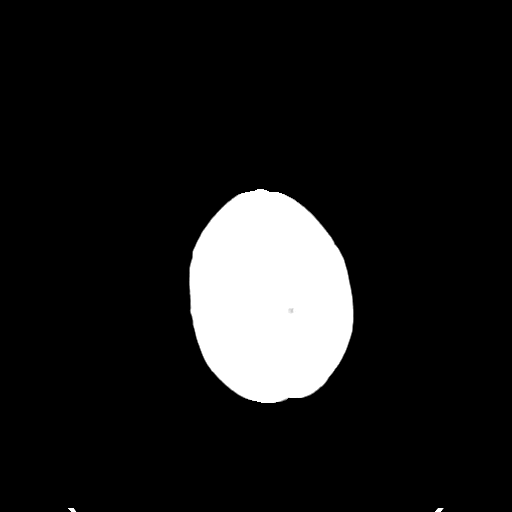
[im 33/36  bone]
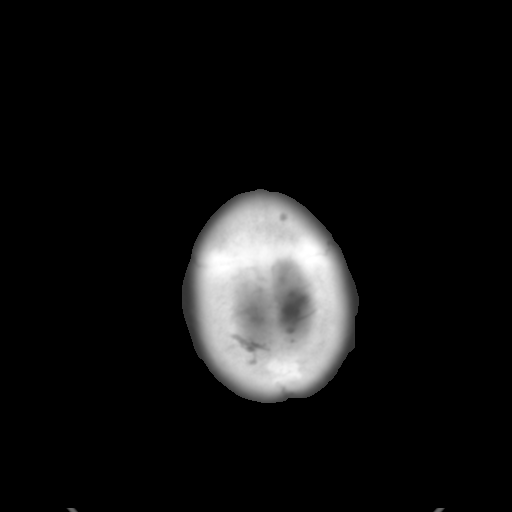

[Series 5: head 3.0 cor st · coronal · 0.36mm/px · 3 of 69 slices shown]
[im 23/69  brain]
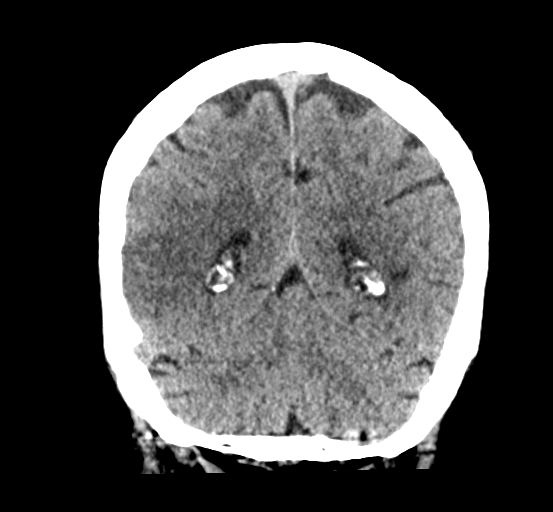
[im 31/69  brain]
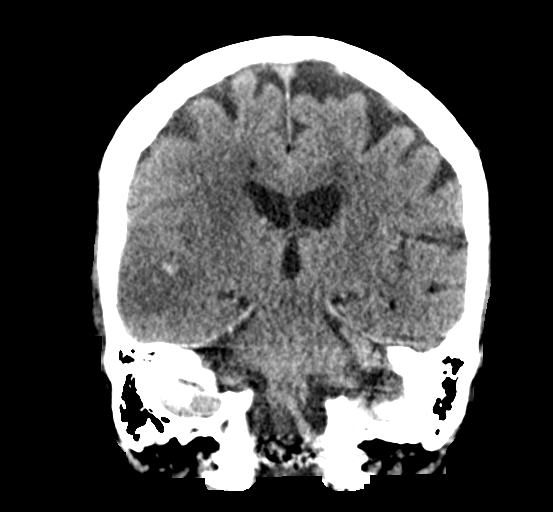
[im 38/69  brain]
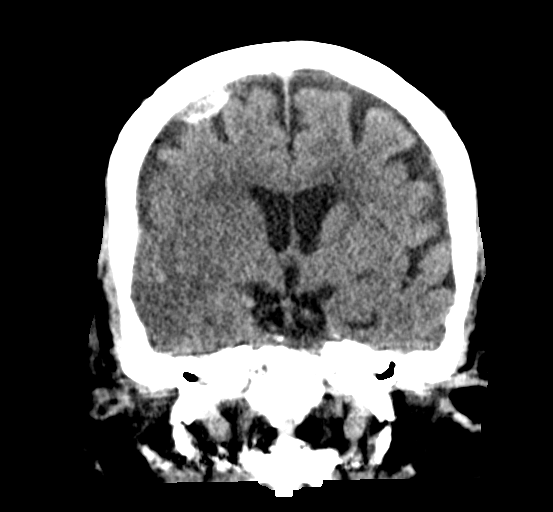

[Series 6: head 3.0 sag st · sagittal · 0.40mm/px · 3 of 66 slices shown]
[im 22/66  brain]
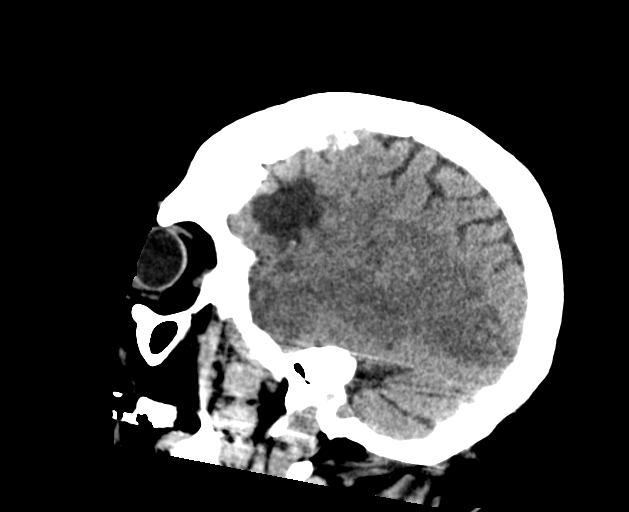
[im 33/66  brain]
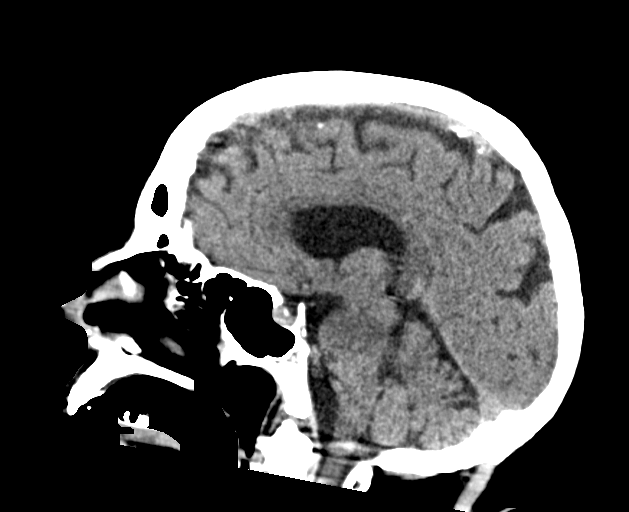
[im 44/66  brain]
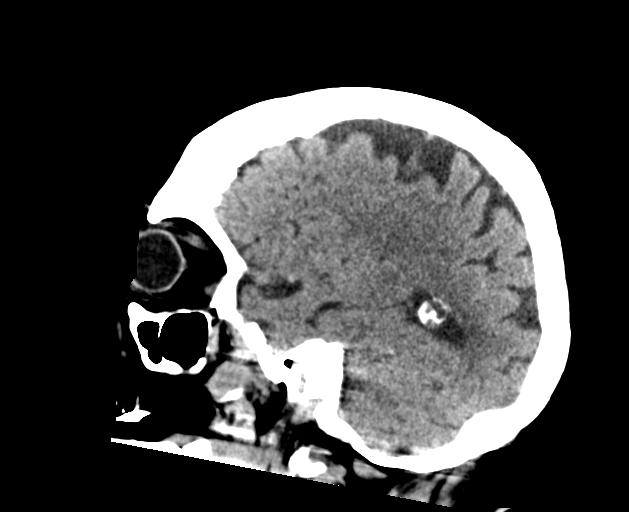

[15 of 47 positions shown; findings below may reference images not displayed]

FINDINGS: Brain: There is hypoattenuation throughout the posterior right MCA
distribution. There are small areas of petechial hemorrhage
measuring up to 6 mm. There is no space-occupying hematoma. A
calcified meningioma along the right frontal convexity has slightly
increased in size, now measuring 2.8 cm, previously 2.4 cm. No
significant mass effect on the underlying brain. Focal area of
encephalomalacia in the right frontal lobe is unchanged. There is no
midline shift or mass effect. There is an old left cerebellar
infarct. Size and configuration of the ventricles are normal.

Vascular: Symmetric vascular density.

Skull: No calvarial or skull base abnormality.

Sinuses/Orbits: No acute finding.

Other: None.

ASPECTS ([place] Stroke Program Early CT Score)

- Ganglionic level infarction (caudate, lentiform nuclei, internal
capsule, insula, M1-M3 cortex): 0

- Supraganglionic infarction (M4-M6 cortex): 3

Total score (0-10 with 10 being normal): 3
IMPRESSION: 1. Posterior right MCA territory acute infarction with areas of
petechial hemorrhage measuring up to 6.5 mm. No associated midline
shift.
2. ASPECTS is 3.
3. Slight increase in size of partially calcified right frontal
convexity meningioma without edema or other focal abnormality of the
underlying brain.

Critical Value/emergent results were called by telephone at the time
of interpretation on 04/24/2017 at [DATE] to Dr. UMER , who
verbally acknowledged these results.
